# Patient Record
Sex: Female | Born: 1955 | Race: White | Hispanic: No | Marital: Married | Smoking: Never smoker
Health system: Southern US, Community
[De-identification: ages and names within clinical notes are randomized; demographics above are authoritative.]

## PROBLEM LIST (undated history)

## (undated) DIAGNOSIS — R112 Nausea with vomiting, unspecified: Secondary | ICD-10-CM

## (undated) DIAGNOSIS — E119 Type 2 diabetes mellitus without complications: Secondary | ICD-10-CM

## (undated) DIAGNOSIS — I10 Essential (primary) hypertension: Secondary | ICD-10-CM

## (undated) DIAGNOSIS — Z9889 Other specified postprocedural states: Secondary | ICD-10-CM

## (undated) DIAGNOSIS — M1990 Unspecified osteoarthritis, unspecified site: Secondary | ICD-10-CM

## (undated) DIAGNOSIS — E669 Obesity, unspecified: Secondary | ICD-10-CM

## (undated) DIAGNOSIS — R200 Anesthesia of skin: Secondary | ICD-10-CM

## (undated) DIAGNOSIS — E7800 Pure hypercholesterolemia, unspecified: Secondary | ICD-10-CM

## (undated) HISTORY — PX: APPENDECTOMY: SHX54

## (undated) HISTORY — PX: OTHER SURGICAL HISTORY: SHX169

---

## 1999-07-22 ENCOUNTER — Encounter: Admission: RE | Admit: 1999-07-22 | Discharge: 1999-07-22 | Payer: Self-pay | Admitting: Gynecology

## 1999-07-22 ENCOUNTER — Encounter: Payer: Self-pay | Admitting: Gynecology

## 2000-04-08 ENCOUNTER — Encounter (INDEPENDENT_AMBULATORY_CARE_PROVIDER_SITE_OTHER): Payer: Self-pay

## 2000-04-08 ENCOUNTER — Other Ambulatory Visit: Admission: RE | Admit: 2000-04-08 | Discharge: 2000-04-08 | Payer: Self-pay | Admitting: Gynecology

## 2000-08-03 ENCOUNTER — Other Ambulatory Visit: Admission: RE | Admit: 2000-08-03 | Discharge: 2000-08-03 | Payer: Self-pay | Admitting: Gynecology

## 2001-02-01 ENCOUNTER — Other Ambulatory Visit: Admission: RE | Admit: 2001-02-01 | Discharge: 2001-02-01 | Payer: Self-pay | Admitting: Gynecology

## 2001-02-25 ENCOUNTER — Encounter: Payer: Self-pay | Admitting: Gynecology

## 2001-02-25 ENCOUNTER — Encounter: Admission: RE | Admit: 2001-02-25 | Discharge: 2001-02-25 | Payer: Self-pay | Admitting: Gynecology

## 2001-07-27 ENCOUNTER — Other Ambulatory Visit: Admission: RE | Admit: 2001-07-27 | Discharge: 2001-07-27 | Payer: Self-pay | Admitting: Gynecology

## 2001-12-16 ENCOUNTER — Other Ambulatory Visit: Admission: RE | Admit: 2001-12-16 | Discharge: 2001-12-16 | Payer: Self-pay | Admitting: Gynecology

## 2002-03-03 ENCOUNTER — Other Ambulatory Visit: Admission: RE | Admit: 2002-03-03 | Discharge: 2002-03-03 | Payer: Self-pay | Admitting: Gynecology

## 2002-03-08 ENCOUNTER — Encounter: Payer: Self-pay | Admitting: Gynecology

## 2002-03-08 ENCOUNTER — Encounter: Admission: RE | Admit: 2002-03-08 | Discharge: 2002-03-08 | Payer: Self-pay | Admitting: Gynecology

## 2002-08-25 ENCOUNTER — Other Ambulatory Visit: Admission: RE | Admit: 2002-08-25 | Discharge: 2002-08-25 | Payer: Self-pay | Admitting: Gynecology

## 2003-03-20 ENCOUNTER — Other Ambulatory Visit: Admission: RE | Admit: 2003-03-20 | Discharge: 2003-03-20 | Payer: Self-pay | Admitting: Gynecology

## 2003-03-22 ENCOUNTER — Encounter: Payer: Self-pay | Admitting: Gynecology

## 2003-03-22 ENCOUNTER — Encounter: Admission: RE | Admit: 2003-03-22 | Discharge: 2003-03-22 | Payer: Self-pay | Admitting: Gynecology

## 2004-03-20 ENCOUNTER — Other Ambulatory Visit: Admission: RE | Admit: 2004-03-20 | Discharge: 2004-03-20 | Payer: Self-pay | Admitting: Gynecology

## 2004-04-09 ENCOUNTER — Ambulatory Visit (HOSPITAL_COMMUNITY): Admission: RE | Admit: 2004-04-09 | Discharge: 2004-04-09 | Payer: Self-pay | Admitting: Gynecology

## 2004-04-23 ENCOUNTER — Encounter: Admission: RE | Admit: 2004-04-23 | Discharge: 2004-04-23 | Payer: Self-pay | Admitting: Gynecology

## 2004-05-14 ENCOUNTER — Encounter: Admission: RE | Admit: 2004-05-14 | Discharge: 2004-05-29 | Payer: Self-pay | Admitting: Internal Medicine

## 2005-04-08 ENCOUNTER — Other Ambulatory Visit: Admission: RE | Admit: 2005-04-08 | Discharge: 2005-04-08 | Payer: Self-pay | Admitting: Gynecology

## 2005-05-28 ENCOUNTER — Ambulatory Visit (HOSPITAL_COMMUNITY): Admission: RE | Admit: 2005-05-28 | Discharge: 2005-05-28 | Payer: Self-pay | Admitting: Gynecology

## 2005-06-25 ENCOUNTER — Encounter: Admission: RE | Admit: 2005-06-25 | Discharge: 2005-06-25 | Payer: Self-pay | Admitting: Gynecology

## 2005-12-29 ENCOUNTER — Encounter: Admission: RE | Admit: 2005-12-29 | Discharge: 2005-12-29 | Payer: Self-pay | Admitting: Gynecology

## 2006-04-28 ENCOUNTER — Other Ambulatory Visit: Admission: RE | Admit: 2006-04-28 | Discharge: 2006-04-28 | Payer: Self-pay | Admitting: Gynecology

## 2006-06-05 ENCOUNTER — Encounter: Admission: RE | Admit: 2006-06-05 | Discharge: 2006-06-05 | Payer: Self-pay | Admitting: Gynecology

## 2007-05-17 ENCOUNTER — Other Ambulatory Visit: Admission: RE | Admit: 2007-05-17 | Discharge: 2007-05-17 | Payer: Self-pay | Admitting: Gynecology

## 2007-07-07 ENCOUNTER — Encounter: Admission: RE | Admit: 2007-07-07 | Discharge: 2007-07-07 | Payer: Self-pay | Admitting: Gynecology

## 2007-07-13 ENCOUNTER — Encounter: Admission: RE | Admit: 2007-07-13 | Discharge: 2007-07-13 | Payer: Self-pay | Admitting: Gynecology

## 2008-01-05 ENCOUNTER — Encounter: Admission: RE | Admit: 2008-01-05 | Discharge: 2008-01-05 | Payer: Self-pay | Admitting: Gynecology

## 2008-07-11 ENCOUNTER — Encounter: Admission: RE | Admit: 2008-07-11 | Discharge: 2008-07-11 | Payer: Self-pay | Admitting: Gynecology

## 2009-06-23 DIAGNOSIS — R112 Nausea with vomiting, unspecified: Secondary | ICD-10-CM

## 2009-06-23 DIAGNOSIS — Z9889 Other specified postprocedural states: Secondary | ICD-10-CM

## 2009-06-23 HISTORY — DX: Nausea with vomiting, unspecified: Z98.890

## 2009-06-23 HISTORY — PX: JOINT REPLACEMENT: SHX530

## 2009-06-23 HISTORY — DX: Other specified postprocedural states: R11.2

## 2009-08-07 ENCOUNTER — Encounter: Admission: RE | Admit: 2009-08-07 | Discharge: 2009-08-07 | Payer: Self-pay | Admitting: Gynecology

## 2009-10-29 ENCOUNTER — Inpatient Hospital Stay (HOSPITAL_COMMUNITY): Admission: RE | Admit: 2009-10-29 | Discharge: 2009-10-30 | Payer: Self-pay | Admitting: Orthopedic Surgery

## 2010-07-15 ENCOUNTER — Other Ambulatory Visit: Payer: Self-pay | Admitting: Gynecology

## 2010-07-15 DIAGNOSIS — Z1239 Encounter for other screening for malignant neoplasm of breast: Secondary | ICD-10-CM

## 2010-08-26 ENCOUNTER — Ambulatory Visit
Admission: RE | Admit: 2010-08-26 | Discharge: 2010-08-26 | Disposition: A | Discharge status: Home / Self Care | Payer: 59 | Source: Ambulatory Visit | Attending: Gynecology | Admitting: Gynecology

## 2010-08-26 DIAGNOSIS — Z1239 Encounter for other screening for malignant neoplasm of breast: Secondary | ICD-10-CM

## 2010-09-10 LAB — URINALYSIS, ROUTINE W REFLEX MICROSCOPIC
Bilirubin Urine: NEGATIVE
Ketones, ur: NEGATIVE mg/dL
Urobilinogen, UA: 0.2 mg/dL (ref 0.0–1.0)
pH: 6.5 (ref 5.0–8.0)

## 2010-09-10 LAB — BASIC METABOLIC PANEL
BUN: 5 mg/dL — ABNORMAL LOW (ref 6–23)
BUN: 8 mg/dL (ref 6–23)
CO2: 29 mEq/L (ref 19–32)
Calcium: 9.4 mg/dL (ref 8.4–10.5)
Chloride: 103 mEq/L (ref 96–112)
Creatinine, Ser: 0.77 mg/dL (ref 0.4–1.2)
GFR calc Af Amer: >60 mL/min (ref >60)
GFR calc non Af Amer: >60 mL/min (ref >60)
Potassium: 3.7 mEq/L (ref 3.5–5.1)
Sodium: 139 mEq/L (ref 135–145)

## 2010-09-10 LAB — DIFFERENTIAL
Basophils Absolute: 0 10*3/uL (ref 0.0–0.1)
Eosinophils Absolute: 0.1 10*3/uL (ref 0.0–0.7)
Lymphocytes Relative: 29 % (ref 12–46)
Lymphs Abs: 2.6 10*3/uL (ref 0.7–4.0)
Monocytes Absolute: 0.6 10*3/uL (ref 0.1–1.0)
Neutro Abs: 5.7 10*3/uL (ref 1.7–7.7)

## 2010-09-10 LAB — ABO/RH: ABO/RH(D): O POS

## 2010-09-10 LAB — PROTIME-INR: Prothrombin Time: 13.3 seconds (ref 11.6–15.2)

## 2010-09-10 LAB — CBC
HCT: 35.7 % — ABNORMAL LOW (ref 36.0–46.0)
Hemoglobin: 11.9 g/dL — ABNORMAL LOW (ref 12.0–15.0)
Hemoglobin: 14.3 g/dL (ref 12.0–15.0)
MCV: 94.6 fL (ref 78.0–100.0)
RBC: 3.73 MIL/uL — ABNORMAL LOW (ref 3.87–5.11)
RBC: 4.43 MIL/uL (ref 3.87–5.11)
WBC: 7.5 10*3/uL (ref 4.0–10.5)

## 2010-09-10 LAB — TYPE AND SCREEN: ABO/RH(D): O POS

## 2010-09-10 LAB — APTT: aPTT: 28 seconds (ref 24–37)

## 2010-09-10 LAB — URINE MICROSCOPIC-ADD ON

## 2011-07-22 ENCOUNTER — Other Ambulatory Visit: Payer: Self-pay | Admitting: Gynecology

## 2011-07-22 DIAGNOSIS — Z1231 Encounter for screening mammogram for malignant neoplasm of breast: Secondary | ICD-10-CM

## 2011-09-01 ENCOUNTER — Ambulatory Visit
Admission: RE | Admit: 2011-09-01 | Discharge: 2011-09-01 | Disposition: A | Discharge status: Home / Self Care | Payer: BC Managed Care – PPO | Source: Ambulatory Visit | Attending: Gynecology | Admitting: Gynecology

## 2011-09-01 DIAGNOSIS — Z1231 Encounter for screening mammogram for malignant neoplasm of breast: Secondary | ICD-10-CM

## 2012-08-09 ENCOUNTER — Other Ambulatory Visit: Payer: Self-pay | Admitting: Family Medicine

## 2012-08-09 DIAGNOSIS — Z1231 Encounter for screening mammogram for malignant neoplasm of breast: Secondary | ICD-10-CM

## 2012-09-06 ENCOUNTER — Ambulatory Visit: Payer: BC Managed Care – PPO

## 2012-09-30 ENCOUNTER — Ambulatory Visit
Admission: RE | Admit: 2012-09-30 | Discharge: 2012-09-30 | Disposition: A | Discharge status: Home / Self Care | Payer: BC Managed Care – PPO | Source: Ambulatory Visit | Attending: Family Medicine | Admitting: Family Medicine

## 2012-09-30 DIAGNOSIS — Z1231 Encounter for screening mammogram for malignant neoplasm of breast: Secondary | ICD-10-CM

## 2013-03-11 ENCOUNTER — Encounter (HOSPITAL_COMMUNITY): Payer: Self-pay | Admitting: Pharmacy Technician

## 2013-03-14 ENCOUNTER — Other Ambulatory Visit (HOSPITAL_COMMUNITY): Payer: Self-pay | Admitting: *Deleted

## 2013-03-15 ENCOUNTER — Ambulatory Visit (HOSPITAL_COMMUNITY)
Admission: RE | Admit: 2013-03-15 | Discharge: 2013-03-15 | Disposition: A | Discharge status: Home / Self Care | Payer: BC Managed Care – PPO | Source: Ambulatory Visit | Attending: Orthopedic Surgery | Admitting: Orthopedic Surgery

## 2013-03-15 ENCOUNTER — Encounter (HOSPITAL_COMMUNITY): Payer: Self-pay

## 2013-03-15 ENCOUNTER — Encounter (HOSPITAL_COMMUNITY)
Admission: RE | Admit: 2013-03-15 | Discharge: 2013-03-15 | Disposition: A | Discharge status: Home / Self Care | Payer: BC Managed Care – PPO | Source: Ambulatory Visit | Attending: Orthopedic Surgery | Admitting: Orthopedic Surgery

## 2013-03-15 DIAGNOSIS — M1610 Unilateral primary osteoarthritis, unspecified hip: Secondary | ICD-10-CM | POA: Insufficient documentation

## 2013-03-15 DIAGNOSIS — Z01818 Encounter for other preprocedural examination: Secondary | ICD-10-CM | POA: Insufficient documentation

## 2013-03-15 DIAGNOSIS — I10 Essential (primary) hypertension: Secondary | ICD-10-CM | POA: Insufficient documentation

## 2013-03-15 DIAGNOSIS — M169 Osteoarthritis of hip, unspecified: Secondary | ICD-10-CM | POA: Insufficient documentation

## 2013-03-15 DIAGNOSIS — Z01812 Encounter for preprocedural laboratory examination: Secondary | ICD-10-CM | POA: Insufficient documentation

## 2013-03-15 HISTORY — DX: Type 2 diabetes mellitus without complications: E11.9

## 2013-03-15 HISTORY — DX: Nausea with vomiting, unspecified: R11.2

## 2013-03-15 HISTORY — DX: Other specified postprocedural states: Z98.890

## 2013-03-15 HISTORY — DX: Essential (primary) hypertension: I10

## 2013-03-15 HISTORY — DX: Unspecified osteoarthritis, unspecified site: M19.90

## 2013-03-15 LAB — PROTIME-INR: INR: 0.96 (ref 0.00–1.49)

## 2013-03-15 LAB — CBC
HCT: 42.2 % (ref 36.0–46.0)
Hemoglobin: 14.9 g/dL (ref 12.0–15.0)
MCH: 32.4 pg (ref 26.0–34.0)
MCHC: 35.3 g/dL (ref 30.0–36.0)
MCV: 91.7 fL (ref 78.0–100.0)
RBC: 4.6 MIL/uL (ref 3.87–5.11)
RDW: 11.6 % (ref 11.5–15.5)

## 2013-03-15 LAB — BASIC METABOLIC PANEL
BUN: 12 mg/dL (ref 6–23)
CO2: 25 mEq/L (ref 19–32)
Calcium: 9.9 mg/dL (ref 8.4–10.5)
Glucose, Bld: 126 mg/dL — ABNORMAL HIGH (ref 70–99)
Potassium: 4 mEq/L (ref 3.5–5.1)
Sodium: 136 mEq/L (ref 135–145)

## 2013-03-15 LAB — URINALYSIS, ROUTINE W REFLEX MICROSCOPIC
Glucose, UA: NEGATIVE mg/dL
Specific Gravity, Urine: 1.022 (ref 1.005–1.030)
Urobilinogen, UA: 0.2 mg/dL (ref 0.0–1.0)
pH: 6 (ref 5.0–8.0)

## 2013-03-15 LAB — URINE MICROSCOPIC-ADD ON

## 2013-03-15 LAB — APTT: aPTT: 28 seconds (ref 24–37)

## 2013-03-15 LAB — SURGICAL PCR SCREEN: Staphylococcus aureus: NEGATIVE

## 2013-03-15 NOTE — Patient Instructions (Addendum)
22 Sandy Carpenter  03/15/2013   Your procedure is scheduled on: 03-22-2013  Report to [facility] at 515 AM.  Call this number if you have problems the morning of surgery [phone]   Remember:   Do not eat food or drink liquids :After Midnight.     Take these medicines the morning of surgery with A SIP OF WATER: no meds to take                                SEE [name] PREPARING FOR SURGERY SHEET             You may not have any metal on your body including hair pins and piercings  Do not wear jewelry, make-up.  Do not wear lotions, powders, or perfumes. You may wear deodorant.   Men may shave face and neck.  Do not bring valuables to the hospital. [facility] IS NOT RESPONSIBLE FOR VALUEABLES.  Contacts, dentures or bridgework may not be worn into surgery.  Leave suitcase in the car. After surgery it may be brought to your room.  For patients admitted to the hospital, checkout time is 11:00 AM the day of discharge.   Patients discharged the day of surgery will not be allowed to drive home.  Name and phone number of your driver:  Special Instructions: N/A   Please read over the following fact sheets that you were given: mrsa information, blood fact sheet, incentive spirometer fact sheet  Call Cain Sieve RN pre op nurse if needed [phone]    FAILURE TO FOLLOW THESE INSTRUCTIONS MAY RESULT IN THE CANCELLATION OF YOUR SURGERY.  PATIENT SIGNATURE___________________________________________  NURSE SIGNATURE_____________________________________________

## 2013-03-15 NOTE — Progress Notes (Signed)
Pt going 03-16-2013 to have dentist check 2nd from back bottom left tooth has been bothering her, pt will let dentist know she is having 03-22-2013 hip replacement and make sure that is ok with dentist.

## 2013-03-15 NOTE — Progress Notes (Signed)
ekg 02-15-13 [facility] medicine on chart Medical clearance note Elizabeth Palau np 02-15-13 on chart

## 2013-03-15 NOTE — Progress Notes (Signed)
Micro ua results fax by epic to dr Charlann Boxer

## 2013-03-20 NOTE — H&P (Signed)
TOTAL HIP ADMISSION H&P  Patient is admitted for left total hip arthroplasty, anterior approach.  Subjective:  Chief Complaint: left hip OA / pain  HPI: Sandy Carpenter, 57 y.o. female, has a history of pain and functional disability in the left hip(s) due to arthritis and patient has failed non-surgical conservative treatments for greater than 12 weeks to include NSAID's and/or analgesics and activity modification.  Onset of symptoms was gradual starting 1+ years ago with rapidlly worsening course since that time.The patient noted no past surgery on the left hip(s).  Patient currently rates pain in the left hip at 10 out of 10 with activity. Patient has night pain, worsening of pain with activity and weight bearing, trendelenberg gait, pain that interfers with activities of daily living and pain with passive range of motion. Patient has evidence of periarticular osteophytes and joint space narrowing by imaging studies.  There is no current active infection.  Risks, benefits and expectations were discussed with the patient. Patient understand the risks, benefits and expectations and wishes to proceed with surgery.   D/C Plans:   Home with HHPT  Post-op Meds:     Rx given for ASA, Robaxin, Celebrex, Iron, Colace and MiraLax  Tranexamic Acid:   To be given  Decadron:    To be given  FYI:    ASA post-op  Norco post-op   Past Medical History  Diagnosis Date  . Hypertension   . Diabetes mellitus without complication     diet controlled  . Arthritis   . PONV (postoperative nausea and vomiting) 2011    Past Surgical History  Procedure Laterality Date  . Joint replacement Right 2011    partial knee  . Arthroscopic knee surgery Right 1984 and 2009  . Appendectomy  age 64    No Known Allergies   History  Substance Use Topics  . Smoking status: Not on file  . Smokeless tobacco: Not on file  . Alcohol Use: Not on file    No family history on file.   Review of Systems   Constitutional: Negative.   HENT: Negative.   Eyes: Negative.   Respiratory: Negative.   Cardiovascular: Negative.   Gastrointestinal: Negative.   Genitourinary: Negative.   Musculoskeletal: Positive for joint pain.  Skin: Negative.   Neurological: Negative.   Endo/Heme/Allergies: Negative.   Psychiatric/Behavioral: Negative.     Objective:  Physical Exam  Constitutional: She is oriented to person, place, and time. She appears well-developed and well-nourished.  HENT:  Head: Normocephalic and atraumatic.  Mouth/Throat: Oropharynx is clear and moist.  Eyes: Pupils are equal, round, and reactive to light.  Neck: Neck supple. No JVD present. No tracheal deviation present. No thyromegaly present.  Cardiovascular: Normal rate, regular rhythm, normal heart sounds and intact distal pulses.   Respiratory: Effort normal and breath sounds normal. No stridor. No respiratory distress. She has no wheezes.  GI: Soft. There is no tenderness. There is no guarding.  Musculoskeletal:       Left hip: She exhibits decreased range of motion, decreased strength, tenderness and bony tenderness. She exhibits no swelling, no deformity and no laceration.  Lymphadenopathy:    She has no cervical adenopathy.  Neurological: She is alert and oriented to person, place, and time.  Skin: Skin is warm and dry.  Psychiatric: She has a normal mood and affect.     Imaging Review Plain radiographs demonstrate severe degenerative joint disease of the left hip(s). The bone quality appears to be good for  age and reported activity level.  Assessment/Plan:  End stage arthritis, left hip(s)  The patient history, physical examination, clinical judgement of the provider and imaging studies are consistent with end stage degenerative joint disease of the left hip(s) and total hip arthroplasty is deemed medically necessary. The treatment options including medical management, injection therapy, arthroscopy and  arthroplasty were discussed at length. The risks and benefits of total hip arthroplasty were presented and reviewed. The risks due to aseptic loosening, infection, stiffness, dislocation/subluxation,  thromboembolic complications and other imponderables were discussed.  The patient acknowledged the explanation, agreed to proceed with the plan and consent was signed. Patient is being admitted for inpatient treatment for surgery, pain control, PT, OT, prophylactic antibiotics, VTE prophylaxis, progressive ambulation and ADL's and discharge planning.The patient is planning to be discharged home with home health services.    Anastasio Auerbach Temitope Griffing   PAC  03/20/2013, 10:04 PM

## 2013-03-22 ENCOUNTER — Inpatient Hospital Stay (HOSPITAL_COMMUNITY)
Admission: RE | Admit: 2013-03-22 | Discharge: 2013-03-23 | DRG: 818 | Disposition: A | Discharge status: Home Health | Payer: BC Managed Care – PPO | Source: Ambulatory Visit | Attending: Orthopedic Surgery | Admitting: Orthopedic Surgery

## 2013-03-22 ENCOUNTER — Encounter (HOSPITAL_COMMUNITY)
Admission: RE | Disposition: A | Discharge status: Home Health | Payer: Self-pay | Source: Ambulatory Visit | Attending: Orthopedic Surgery

## 2013-03-22 ENCOUNTER — Encounter (HOSPITAL_COMMUNITY): Payer: Self-pay | Admitting: *Deleted

## 2013-03-22 ENCOUNTER — Ambulatory Visit (HOSPITAL_COMMUNITY): Payer: BC Managed Care – PPO

## 2013-03-22 ENCOUNTER — Ambulatory Visit (HOSPITAL_COMMUNITY): Payer: BC Managed Care – PPO | Admitting: Certified Registered Nurse Anesthetist

## 2013-03-22 ENCOUNTER — Encounter (HOSPITAL_COMMUNITY): Payer: Self-pay | Admitting: Certified Registered Nurse Anesthetist

## 2013-03-22 DIAGNOSIS — I10 Essential (primary) hypertension: Secondary | ICD-10-CM | POA: Diagnosis present

## 2013-03-22 DIAGNOSIS — E119 Type 2 diabetes mellitus without complications: Secondary | ICD-10-CM | POA: Diagnosis present

## 2013-03-22 DIAGNOSIS — D500 Iron deficiency anemia secondary to blood loss (chronic): Secondary | ICD-10-CM | POA: Diagnosis not present

## 2013-03-22 DIAGNOSIS — Z6834 Body mass index (BMI) 34.0-34.9, adult: Secondary | ICD-10-CM

## 2013-03-22 DIAGNOSIS — Z96649 Presence of unspecified artificial hip joint: Secondary | ICD-10-CM

## 2013-03-22 DIAGNOSIS — E669 Obesity, unspecified: Secondary | ICD-10-CM | POA: Diagnosis present

## 2013-03-22 DIAGNOSIS — M169 Osteoarthritis of hip, unspecified: Principal | ICD-10-CM | POA: Diagnosis present

## 2013-03-22 DIAGNOSIS — D62 Acute posthemorrhagic anemia: Secondary | ICD-10-CM | POA: Diagnosis not present

## 2013-03-22 DIAGNOSIS — M1610 Unilateral primary osteoarthritis, unspecified hip: Principal | ICD-10-CM | POA: Diagnosis present

## 2013-03-22 HISTORY — PX: TOTAL HIP ARTHROPLASTY: SHX124

## 2013-03-22 LAB — TYPE AND SCREEN: Antibody Screen: NEGATIVE

## 2013-03-22 LAB — GLUCOSE, CAPILLARY: Glucose-Capillary: 110 mg/dL — ABNORMAL HIGH (ref 70–99)

## 2013-03-22 SURGERY — ARTHROPLASTY, HIP, TOTAL, ANTERIOR APPROACH
Anesthesia: Spinal | Site: Hip | Laterality: Left | Wound class: Clean

## 2013-03-22 MED ORDER — FERROUS SULFATE 325 (65 FE) MG PO TABS
325.0000 mg | ORAL_TABLET | Freq: Three times a day (TID) | ORAL | Status: DC
  Administered 2013-03-23 (×2): 325 mg via ORAL
  Filled 2013-03-22 (×5): qty 1

## 2013-03-22 MED ORDER — BUPIVACAINE HCL (PF) 0.5 % IJ SOLN
INTRAMUSCULAR | Status: AC
  Filled 2013-03-22: qty 30

## 2013-03-22 MED ORDER — NON FORMULARY: 20.0000 mg | Freq: Every day | Status: DC

## 2013-03-22 MED ORDER — LIDOCAINE HCL (CARDIAC) 20 MG/ML IV SOLN
INTRAVENOUS | Status: DC | PRN
  Administered 2013-03-22: 50 mg via INTRAVENOUS

## 2013-03-22 MED ORDER — CALCIUM CARBONATE-VITAMIN D 500-200 MG-UNIT PO TABS
1.0000 | ORAL_TABLET | Freq: Two times a day (BID) | ORAL | Status: DC
  Administered 2013-03-22 – 2013-03-23 (×2): 1 via ORAL
  Filled 2013-03-22 (×3): qty 1

## 2013-03-22 MED ORDER — HYDROMORPHONE HCL PF 1 MG/ML IJ SOLN
0.2500 mg | INTRAMUSCULAR | Status: DC | PRN
  Administered 2013-03-22 (×2): 0.5 mg via INTRAVENOUS

## 2013-03-22 MED ORDER — BUPIVACAINE HCL (PF) 0.5 % IJ SOLN
INTRAMUSCULAR | Status: DC | PRN
  Administered 2013-03-22: 15 mg

## 2013-03-22 MED ORDER — MIDAZOLAM HCL 5 MG/5ML IJ SOLN
INTRAMUSCULAR | Status: DC | PRN
  Administered 2013-03-22: 0.5 mg via INTRAVENOUS
  Administered 2013-03-22: 2 mg via INTRAVENOUS
  Administered 2013-03-22: 0.5 mg via INTRAVENOUS
  Administered 2013-03-22: 1 mg via INTRAVENOUS

## 2013-03-22 MED ORDER — DEXAMETHASONE SODIUM PHOSPHATE 10 MG/ML IJ SOLN
10.0000 mg | Freq: Once | INTRAMUSCULAR | Status: AC
  Administered 2013-03-22: 10 mg via INTRAVENOUS

## 2013-03-22 MED ORDER — PHENYLEPHRINE HCL 10 MG/ML IJ SOLN
INTRAMUSCULAR | Status: DC | PRN
  Administered 2013-03-22 (×2): 40 ug via INTRAVENOUS
  Administered 2013-03-22: 60 ug via INTRAVENOUS
  Administered 2013-03-22: 40 ug via INTRAVENOUS

## 2013-03-22 MED ORDER — STERILE WATER FOR IRRIGATION IR SOLN
Status: DC | PRN
  Administered 2013-03-22: 3000 mL

## 2013-03-22 MED ORDER — DEXAMETHASONE SODIUM PHOSPHATE 10 MG/ML IJ SOLN
10.0000 mg | Freq: Once | INTRAMUSCULAR | Status: AC
  Administered 2013-03-23: 09:00:00 10 mg via INTRAVENOUS
  Filled 2013-03-22: qty 1

## 2013-03-22 MED ORDER — SODIUM CHLORIDE 0.9 % IV SOLN
INTRAVENOUS | Status: DC
  Administered 2013-03-23: 03:00:00 via INTRAVENOUS
  Filled 2013-03-22 (×5): qty 1000

## 2013-03-22 MED ORDER — PROMETHAZINE HCL 25 MG/ML IJ SOLN: 6.2500 mg | INTRAMUSCULAR | Status: DC | PRN

## 2013-03-22 MED ORDER — HYDROCODONE-ACETAMINOPHEN 7.5-325 MG PO TABS
1.0000 | ORAL_TABLET | ORAL | Status: DC
  Administered 2013-03-22: 2 via ORAL
  Administered 2013-03-22: 1 via ORAL
  Administered 2013-03-23 (×4): 2 via ORAL
  Filled 2013-03-22 (×2): qty 2
  Filled 2013-03-22: qty 1
  Filled 2013-03-22 (×3): qty 2

## 2013-03-22 MED ORDER — HYDROMORPHONE HCL PF 1 MG/ML IJ SOLN
0.5000 mg | INTRAMUSCULAR | Status: DC | PRN
  Administered 2013-03-22 (×2): 1 mg via INTRAVENOUS
  Filled 2013-03-22: qty 1

## 2013-03-22 MED ORDER — VITAMIN D3 25 MCG (1000 UNIT) PO TABS
1000.0000 [IU] | ORAL_TABLET | Freq: Every day | ORAL | Status: DC
  Administered 2013-03-23: 1000 [IU] via ORAL
  Filled 2013-03-22: qty 1

## 2013-03-22 MED ORDER — HYDROMORPHONE HCL PF 1 MG/ML IJ SOLN
INTRAMUSCULAR | Status: AC
  Filled 2013-03-22: qty 1

## 2013-03-22 MED ORDER — SENNA 8.6 MG PO TABS
1.0000 | ORAL_TABLET | Freq: Two times a day (BID) | ORAL | Status: DC
  Administered 2013-03-22: 21:00:00 8.6 mg via ORAL
  Filled 2013-03-22: qty 1

## 2013-03-22 MED ORDER — POLYETHYLENE GLYCOL 3350 17 G PO PACK: 17.0000 g | PACK | Freq: Every day | ORAL | Status: DC | PRN

## 2013-03-22 MED ORDER — ALUM & MAG HYDROXIDE-SIMETH 200-200-20 MG/5ML PO SUSP: 30.0000 mL | ORAL | Status: DC | PRN

## 2013-03-22 MED ORDER — CEFAZOLIN SODIUM-DEXTROSE 2-3 GM-% IV SOLR
2.0000 g | Freq: Four times a day (QID) | INTRAVENOUS | Status: AC
  Administered 2013-03-22 (×2): 2 g via INTRAVENOUS
  Filled 2013-03-22 (×2): qty 50

## 2013-03-22 MED ORDER — DOXEPIN HCL 25 MG PO CAPS
25.0000 mg | ORAL_CAPSULE | Freq: Every day | ORAL | Status: DC
  Administered 2013-03-22: 21:00:00 25 mg via ORAL
  Filled 2013-03-22 (×2): qty 1

## 2013-03-22 MED ORDER — DIPHENHYDRAMINE HCL 12.5 MG/5ML PO ELIX: 25.0000 mg | ORAL_SOLUTION | Freq: Four times a day (QID) | ORAL | Status: DC | PRN

## 2013-03-22 MED ORDER — ONDANSETRON HCL 4 MG PO TABS: 4.0000 mg | ORAL_TABLET | Freq: Four times a day (QID) | ORAL | Status: DC | PRN

## 2013-03-22 MED ORDER — ASPIRIN EC 325 MG PO TBEC
325.0000 mg | DELAYED_RELEASE_TABLET | Freq: Two times a day (BID) | ORAL | Status: DC
  Administered 2013-03-23: 325 mg via ORAL
  Filled 2013-03-22 (×3): qty 1

## 2013-03-22 MED ORDER — DOCUSATE SODIUM 100 MG PO CAPS
100.0000 mg | ORAL_CAPSULE | Freq: Two times a day (BID) | ORAL | Status: DC
  Administered 2013-03-22 – 2013-03-23 (×2): 100 mg via ORAL

## 2013-03-22 MED ORDER — METHOCARBAMOL 500 MG PO TABS
500.0000 mg | ORAL_TABLET | Freq: Four times a day (QID) | ORAL | Status: DC | PRN
  Administered 2013-03-23: 02:00:00 500 mg via ORAL
  Filled 2013-03-22: qty 1

## 2013-03-22 MED ORDER — 0.9 % SODIUM CHLORIDE (POUR BTL) OPTIME
TOPICAL | Status: DC | PRN
  Administered 2013-03-22: 1000 mL

## 2013-03-22 MED ORDER — MENTHOL 3 MG MT LOZG: 1.0000 | LOZENGE | OROMUCOSAL | Status: DC | PRN

## 2013-03-22 MED ORDER — LACTATED RINGERS IV SOLN
INTRAVENOUS | Status: DC | PRN
  Administered 2013-03-22 (×3): via INTRAVENOUS

## 2013-03-22 MED ORDER — OLMESARTAN MEDOXOMIL 20 MG PO TABS
20.0000 mg | ORAL_TABLET | Freq: Every day | ORAL | Status: DC
  Administered 2013-03-23: 20 mg via ORAL
  Filled 2013-03-22: qty 1

## 2013-03-22 MED ORDER — ONDANSETRON HCL 4 MG/2ML IJ SOLN
4.0000 mg | Freq: Four times a day (QID) | INTRAMUSCULAR | Status: DC | PRN
  Administered 2013-03-22 (×2): 4 mg via INTRAVENOUS
  Filled 2013-03-22 (×2): qty 2

## 2013-03-22 MED ORDER — FENTANYL CITRATE 0.05 MG/ML IJ SOLN
INTRAMUSCULAR | Status: DC | PRN
  Administered 2013-03-22: 100 ug via INTRAVENOUS

## 2013-03-22 MED ORDER — CEFAZOLIN SODIUM-DEXTROSE 2-3 GM-% IV SOLR
INTRAVENOUS | Status: AC
  Filled 2013-03-22: qty 50

## 2013-03-22 MED ORDER — ONDANSETRON HCL 4 MG/2ML IJ SOLN
INTRAMUSCULAR | Status: AC | PRN
  Administered 2013-03-22 – 2015-09-10 (×2): 4 mg via INTRAVENOUS

## 2013-03-22 MED ORDER — TRANEXAMIC ACID 100 MG/ML IV SOLN
1000.0000 mg | Freq: Once | INTRAVENOUS | Status: AC
  Administered 2013-03-22: 1000 mg via INTRAVENOUS
  Filled 2013-03-22: qty 10

## 2013-03-22 MED ORDER — METHOCARBAMOL 100 MG/ML IJ SOLN
500.0000 mg | Freq: Four times a day (QID) | INTRAVENOUS | Status: DC | PRN
  Administered 2013-03-22: 500 mg via INTRAVENOUS
  Filled 2013-03-22: qty 5

## 2013-03-22 MED ORDER — PROPOFOL INFUSION 10 MG/ML OPTIME
INTRAVENOUS | Status: DC | PRN
  Administered 2013-03-22: 120 ug/kg/min via INTRAVENOUS

## 2013-03-22 MED ORDER — CEFAZOLIN SODIUM-DEXTROSE 2-3 GM-% IV SOLR
2.0000 g | INTRAVENOUS | Status: AC
  Administered 2013-03-22: 2 g via INTRAVENOUS

## 2013-03-22 MED ORDER — PHENOL 1.4 % MT LIQD: 1.0000 | OROMUCOSAL | Status: DC | PRN

## 2013-03-22 SURGICAL SUPPLY — 40 items
ADH SKN CLS APL DERMABOND .7 (GAUZE/BANDAGES/DRESSINGS) ×1
BAG SPEC THK2 15X12 ZIP CLS (MISCELLANEOUS) ×2
BAG ZIPLOCK 12X15 (MISCELLANEOUS) ×4 IMPLANT
BLADE SAW SGTL 18X1.27X75 (BLADE) ×2 IMPLANT
CAPT HIP PF COP ×2 IMPLANT
CLOTH BEACON ORANGE TIMEOUT ST (SAFETY) ×2 IMPLANT
DERMABOND ADVANCED (GAUZE/BANDAGES/DRESSINGS) ×1
DERMABOND ADVANCED .7 DNX12 (GAUZE/BANDAGES/DRESSINGS) ×1 IMPLANT
DRAPE C-ARM 42X120 X-RAY (DRAPES) ×2 IMPLANT
DRAPE STERI IOBAN 125X83 (DRAPES) ×2 IMPLANT
DRAPE U-SHAPE 47X51 STRL (DRAPES) ×6 IMPLANT
DRSG AQUACEL AG ADV 3.5X10 (GAUZE/BANDAGES/DRESSINGS) ×2 IMPLANT
DRSG TEGADERM 4X4.75 (GAUZE/BANDAGES/DRESSINGS) IMPLANT
DURAPREP 26ML APPLICATOR (WOUND CARE) ×2 IMPLANT
ELECT BLADE TIP CTD 4 INCH (ELECTRODE) ×2 IMPLANT
ELECT REM PT RETURN 9FT ADLT (ELECTROSURGICAL) ×2
ELECTRODE REM PT RTRN 9FT ADLT (ELECTROSURGICAL) ×1 IMPLANT
EVACUATOR 1/8 PVC DRAIN (DRAIN) IMPLANT
FACESHIELD LNG OPTICON STERILE (SAFETY) ×8 IMPLANT
GAUZE SPONGE 2X2 8PLY STRL LF (GAUZE/BANDAGES/DRESSINGS) ×1 IMPLANT
GLOVE BIOGEL PI IND STRL 7.5 (GLOVE) ×1 IMPLANT
GLOVE BIOGEL PI IND STRL 8 (GLOVE) ×1 IMPLANT
GLOVE BIOGEL PI INDICATOR 7.5 (GLOVE) ×1
GLOVE BIOGEL PI INDICATOR 8 (GLOVE) ×1
GLOVE ECLIPSE 8.0 STRL XLNG CF (GLOVE) ×2 IMPLANT
GLOVE ORTHO TXT STRL SZ7.5 (GLOVE) ×4 IMPLANT
GOWN BRE IMP PREV XXLGXLNG (GOWN DISPOSABLE) ×2 IMPLANT
GOWN PREVENTION PLUS LG XLONG (DISPOSABLE) ×2 IMPLANT
KIT BASIN OR (CUSTOM PROCEDURE TRAY) ×2 IMPLANT
PACK TOTAL JOINT (CUSTOM PROCEDURE TRAY) ×2 IMPLANT
PADDING CAST COTTON 6X4 STRL (CAST SUPPLIES) ×2 IMPLANT
SPONGE GAUZE 2X2 STER 10/PKG (GAUZE/BANDAGES/DRESSINGS) ×1
SUCTION FRAZIER 12FR DISP (SUCTIONS) ×2 IMPLANT
SUT MNCRL AB 4-0 PS2 18 (SUTURE) ×2 IMPLANT
SUT VIC AB 1 CT1 36 (SUTURE) ×8 IMPLANT
SUT VIC AB 2-0 CT1 27 (SUTURE) ×2
SUT VIC AB 2-0 CT1 TAPERPNT 27 (SUTURE) ×2 IMPLANT
SUT VLOC 180 0 24IN GS25 (SUTURE) ×2 IMPLANT
TOWEL OR 17X26 10 PK STRL BLUE (TOWEL DISPOSABLE) ×4 IMPLANT
TRAY FOLEY CATH 14FRSI W/METER (CATHETERS) ×2 IMPLANT

## 2013-03-22 NOTE — Anesthesia Procedure Notes (Signed)

## 2013-03-22 NOTE — Anesthesia Postprocedure Evaluation (Signed)
  Anesthesia Post-op Note  Patient: Sandy Carpenter  Procedure(s) Performed: Procedure(s) (LRB): LEFT TOTAL HIP ARTHROPLASTY ANTERIOR APPROACH (Left)  Patient Location: PACU  Anesthesia Type: Spinal  Level of Consciousness: awake and alert   Airway and Oxygen Therapy: Patient Spontanous Breathing  Post-op Pain: mild  Post-op Assessment: Post-op Vital signs reviewed, Patient's Cardiovascular Status Stable, Respiratory Function Stable, Patent Airway and No signs of Nausea or vomiting  Last Vitals:  Filed Vitals:   03/22/13 1015  BP: 110/83  Pulse: 67  Temp: 36.8 C  Resp: 12    Post-op Vital Signs: stable   Complications: No apparent anesthesia complications

## 2013-03-22 NOTE — Evaluation (Signed)
Physical Therapy Evaluation Patient Details Name: Sandy Carpenter MRN: [id number] DOB: 10-Apr-1956 Today's Date: 03/22/2013 Time: 1640-1700 PT Time Calculation (min): 20 min  PT Assessment / Plan / Recommendation History of Present Illness  LDATHA on 03/22/13  Clinical Impression  Pt tolerated ambulating x 100'. Became more nauseated after PT. Pt will benefit from PT to address problems listed below. Plans DC tomorrow.    PT Assessment  Patient needs continued PT services    Follow Up Recommendations  Home health PT    Does the patient have the potential to tolerate intense rehabilitation      Barriers to Discharge        Equipment Recommendations  None recommended by PT (has a RW)    Recommendations for Other Services     Frequency 7X/week    Precautions / Restrictions Precautions Precautions: None Restrictions Weight Bearing Restrictions: No   Pertinent Vitals/Pain L thigh/hip5 after meds when moving. None at rest.      Mobility  Bed Mobility Bed Mobility: Supine to Sit;Sitting - Scoot to Edge of Bed Supine to Sit: 3: Mod assist Sitting - Scoot to Edge of Bed: 4: Min assist Details for Bed Mobility Assistance: cues for using UE's to assist in scooting to edge of bed as LLE supported. Transfers Transfers: Sit to Stand;Stand to Sit Sit to Stand: 4: Min assist;From bed;With upper extremity assist;From elevated surface Stand to Sit: 4: Min guard;With armrests;With upper extremity assist;To chair/3-in-1 Details for Transfer Assistance: multimodal cues for use of UE's and LLE palcement prior to sitting down. Ambulation/Gait Ambulation/Gait Assistance: 4: Min assist Ambulation Distance (Feet): 100 Feet Assistive device: Rolling walker Ambulation/Gait Assistance Details: cues for sequence and attempts to externally rotate LLE during swing. LLE tends to rotate inward.    Exercises Total Joint Exercises Quad Sets: AROM;Left;10 reps;Supine Heel Slides: AAROM;Left;10  reps;Supine   PT Diagnosis: Difficulty walking;Acute pain  PT Problem List: Decreased strength;Decreased range of motion;Decreased activity tolerance;Decreased mobility;Decreased knowledge of use of DME;Decreased safety awareness;Pain PT Treatment Interventions: DME instruction;Gait training;Stair training;Functional mobility training;Therapeutic activities;Therapeutic exercise;Patient/family education     PT Goals(Current goals can be found in the care plan section) Acute Rehab PT Goals Patient Stated Goal: I want to get this pain gone. PT Goal Formulation: With patient/family Time For Goal Achievement: 03/26/13 Potential to Achieve Goals: Good  Visit Information  Last PT Received On: 03/22/13 Assistance Needed: +1 History of Present Illness: LDATHA on 03/22/13       Prior Functioning  Home Living Family/patient expects to be discharged to:: Private residence Living Arrangements: Spouse/significant other Available Help at Discharge: Family Type of Home: House Home Access: Stairs to enter Secretary/administrator of Steps: 1 Entrance Stairs-Rails: None Home Layout: One level Home Equipment: Environmental consultant - 2 wheels Prior Function Level of Independence: Independent Communication Communication: No difficulties    Cognition  Cognition Arousal/Alertness: Awake/alert Behavior During Therapy: WFL for tasks assessed/performed Overall Cognitive Status: Within Functional Limits for tasks assessed    Extremity/Trunk Assessment Upper Extremity Assessment Upper Extremity Assessment: Overall WFL for tasks assessed Lower Extremity Assessment Lower Extremity Assessment: LLE deficits/detail LLE Deficits / Details: requires assistance for moving LLE to edge, cues for technique to turn trunk on bed . Able to advance LLE  during swing.   Balance    End of Session PT - End of Session Activity Tolerance: Patient tolerated treatment well Patient left: in chair;with call bell/phone within  reach;with family/visitor present Nurse Communication: Mobility status  GP  Rada Hay 03/22/2013, 5:29 PM

## 2013-03-22 NOTE — Op Note (Addendum)
NAME:  Sandy Carpenter                ACCOUNT NO.: [id number]      MEDICAL RECORD NO.: [id number]      FACILITY:  [facility]      PHYSICIAN:  Durene Romans D  DATE OF BIRTH:  10/03/1955     DATE OF PROCEDURE:  03/22/2013                                 OPERATIVE REPORT         PREOPERATIVE DIAGNOSIS: Left  hip osteoarthritis.      POSTOPERATIVE DIAGNOSIS:  Left hip osteoarthritis.      PROCEDURE:  Left total hip replacement through an anterior approach   utilizing DePuy THR system, component size 52mm pinnacle cup, a size 36+4 neutral   Altrex liner, a size 3 Hi Tri Lock stem with a 36+5 delta ceramic   ball.      SURGEON:  Madlyn Frankel. Charlann Boxer, M.D.      ASSISTANT:  Leilani Able, PA-C     ANESTHESIA:  Spinal.      SPECIMENS:  None.      COMPLICATIONS:  None.      BLOOD LOSS:  350 cc     DRAINS:  None      INDICATION OF THE PROCEDURE:  Sandy Carpenter is a 57 y.o. female who had   presented to office for evaluation of left hip pain.  Radiographs revealed   progressive degenerative changes with bone-on-bone   articulation to the  hip joint.  The patient had painful limited range of   motion significantly affecting their overall quality of life.  The patient was failing to    respond to conservative measures, and at this point was ready   to proceed with more definitive measures.  The patient has noted progressive   degenerative changes in his hip, progressive problems and dysfunction   with regarding the hip prior to surgery.  Consent was obtained for   benefit of pain relief.  Specific risk of infection, DVT, component   failure, dislocation, need for revision surgery, as well discussion of   the anterior versus posterior approach were reviewed.  Consent was   obtained for benefit of anterior pain relief through an anterior   approach.      PROCEDURE IN DETAIL:  The patient was brought to operative theater.   Once adequate anesthesia,  preoperative antibiotics, 2gm Ancef administered.   The patient was positioned supine on the OSI Hanna table.  Once adequate   padding of boney process was carried out, we had predraped out the hip, and  used fluoroscopy to confirm orientation of the pelvis and position.      The left hip was then prepped and draped from proximal iliac crest to   mid thigh with shower curtain technique.      Time-out was performed identifying the patient, planned procedure, and   extremity.     An incision was then made 2 cm distal and lateral to the   anterior superior iliac spine extending over the orientation of the   tensor fascia lata muscle and sharp dissection was carried down to the   fascia of the muscle and protractor placed in the soft tissues.      The fascia was then incised.  The muscle belly was identified and swept  laterally and retractor placed along the superior neck.  Following   cauterization of the circumflex vessels and removing some pericapsular   fat, a second cobra retractor was placed on the inferior neck.  A third   retractor was placed on the anterior acetabulum after elevating the   anterior rectus.  A L-capsulotomy was along the line of the   superior neck to the trochanteric fossa, then extended proximally and   distally.  Tag sutures were placed and the retractors were then placed   intracapsular.  We then identified the trochanteric fossa and   orientation of my neck cut, confirmed this radiographically   and then made a neck osteotomy with the femur on traction.  The femoral   head was removed without difficulty or complication.  Traction was let   off and retractors were placed posterior and anterior around the   acetabulum.      The labrum and foveal tissue were debrided.  I began reaming with a 47mm   reamer and reamed up to 51mm reamer with good bony bed preparation and a 52   cup was chosen.  The final 52mm Pinnacle cup was then impacted under fluoroscopy  to  confirm the depth of penetration and orientation with respect to   abduction.  A screw was placed followed by the hole eliminator.  The final   36+4 neutral Altrex liner was impacted with good visualized rim fit.  The cup was positioned anatomically within the acetabular portion of the pelvis.      At this point, the femur was rolled at 80 degrees.  Further capsule was   released off the inferior aspect of the femoral neck.  I then   released the superior capsule proximally.  The hook was placed laterally   along the femur and elevated manually and held in position with the bed   hook.  The leg was then extended and adducted with the leg rolled to 100   degrees of external rotation.  Once the proximal femur was fully   exposed, I used a box osteotome to set orientation.  I then began   broaching with the starting chili pepper broach and passed this by hand and then broached up to 3.  With the 3 broach in place I chose a high offset neck and did a trial reduction.  The offset was appropriate, leg lengths   appeared to be equal, confirmed radiographically.   Given these findings, I went ahead and dislocated the hip, repositioned all   retractors and positioned the right hip in the extended and abducted position.  The final 3 Hi Tri Lock stem was   chosen and it was impacted down to the level of neck cut.  Based on this   and the trial reduction, a 36+5 delta ceramic ball was chosen based on re-trials and then   impacted onto a clean and dry trunnion, and the hip was reduced.  The   hip had been irrigated throughout the case again at this point.  I did   reapproximate the superior capsular leaflet to the anterior leaflet   using #1 Vicryl.  The fascia of the   tensor fascia lata muscle was then reapproximated using #1 Vicryl.  The   remaining wound was closed with 2-0 Vicryl and running 4-0 Monocryl.   The hip was cleaned, dried, and dressed sterilely using Dermabond and   Aquacel dressing.   She was then brought   to recovery room  in stable condition tolerating the procedure well.    Leilani Able, PA-C was present for the entirety of the case involved from   preoperative positioning, perioperative retractor management, general   facilitation of the case, as well as primary wound closure as assistant.            Madlyn Frankel Charlann Boxer, M.D.            MDO/MEDQ  D:  04/15/2011  T:  04/15/2011  Job:  [id number]      Electronically Signed by Durene Romans M.D. on 04/21/2011 09:15:38 AM

## 2013-03-22 NOTE — Progress Notes (Signed)
Utilization review completed.  

## 2013-03-22 NOTE — Interval H&P Note (Signed)
History and Physical Interval Note:  03/22/2013 6:47 AM  Sandy Carpenter  has presented today for surgery, with the diagnosis of LEFT HIP OA  The various methods of treatment have been discussed with the patient and family. After consideration of risks, benefits and other options for treatment, the patient has consented to  Procedure(s): LEFT TOTAL HIP ARTHROPLASTY ANTERIOR APPROACH (Left) as a surgical intervention .  The patient's history has been reviewed, patient examined, no change in status, stable for surgery.  I have reviewed the patient's chart and labs.  Questions were answered to the patient's satisfaction.     Shelda Pal

## 2013-03-22 NOTE — Anesthesia Preprocedure Evaluation (Addendum)
Anesthesia Evaluation  Patient identified by MRN, date of birth, ID band Patient awake    Reviewed: Allergy & Precautions, H&P , NPO status , Patient's Chart, lab work & pertinent test results  Airway Mallampati: II TM Distance: >3 FB Neck ROM: Full    Dental no notable dental hx.    Pulmonary neg pulmonary ROS,  breath sounds clear to auscultation  Pulmonary exam normal       Cardiovascular hypertension, Pt. on medications Rhythm:Regular Rate:Normal     Neuro/Psych negative neurological ROS  negative psych ROS   GI/Hepatic negative GI ROS, Neg liver ROS,   Endo/Other  diabetes  Renal/GU negative Renal ROS  negative genitourinary   Musculoskeletal negative musculoskeletal ROS (+)   Abdominal   Peds negative pediatric ROS (+)  Hematology negative hematology ROS (+)   Anesthesia Other Findings   Reproductive/Obstetrics negative OB ROS                           Anesthesia Physical Anesthesia Plan  ASA: II  Anesthesia Plan: Spinal   Post-op Pain Management:    Induction: Intravenous  Airway Management Planned:   Additional Equipment:   Intra-op Plan:   Post-operative Plan:   Informed Consent: I have reviewed the patients History and Physical, chart, labs and discussed the procedure including the risks, benefits and alternatives for the proposed anesthesia with the patient or authorized representative who has indicated his/her understanding and acceptance.   Dental advisory given  Plan Discussed with: CRNA and Surgeon  Anesthesia Plan Comments:        Anesthesia Quick Evaluation

## 2013-03-22 NOTE — Transfer of Care (Signed)
Immediate Anesthesia Transfer of Care Note  Patient: Sandy Carpenter  Procedure(s) Performed: Procedure(s): LEFT TOTAL HIP ARTHROPLASTY ANTERIOR APPROACH (Left)  Patient Location: PACU  Anesthesia Type:Spinal  Level of Consciousness: awake, alert , oriented and patient cooperative  Airway & Oxygen Therapy: Patient Spontanous Breathing and Patient connected to face mask oxygen  Post-op Assessment: Report given to PACU RN and Post -op Vital signs reviewed and stable  Post vital signs: Reviewed and stable  Complications: No apparent anesthesia complications

## 2013-03-23 ENCOUNTER — Encounter (HOSPITAL_COMMUNITY): Payer: Self-pay | Admitting: Orthopedic Surgery

## 2013-03-23 DIAGNOSIS — D500 Iron deficiency anemia secondary to blood loss (chronic): Secondary | ICD-10-CM | POA: Diagnosis not present

## 2013-03-23 DIAGNOSIS — E669 Obesity, unspecified: Secondary | ICD-10-CM | POA: Diagnosis present

## 2013-03-23 LAB — CBC
HCT: 33.1 % — ABNORMAL LOW (ref 36.0–46.0)
Hemoglobin: 11.5 g/dL — ABNORMAL LOW (ref 12.0–15.0)
MCH: 31.7 pg (ref 26.0–34.0)
MCV: 91.2 fL (ref 78.0–100.0)
Platelets: 214 10*3/uL (ref 150–400)
RBC: 3.63 MIL/uL — ABNORMAL LOW (ref 3.87–5.11)

## 2013-03-23 LAB — BASIC METABOLIC PANEL
BUN: 12 mg/dL (ref 6–23)
CO2: 26 mEq/L (ref 19–32)
Calcium: 9.1 mg/dL (ref 8.4–10.5)
Creatinine, Ser: 0.56 mg/dL (ref 0.50–1.10)
Glucose, Bld: 134 mg/dL — ABNORMAL HIGH (ref 70–99)
Sodium: 135 mEq/L (ref 135–145)

## 2013-03-23 MED ORDER — POLYETHYLENE GLYCOL 3350 17 G PO PACK: 17.0000 g | PACK | Freq: Two times a day (BID) | ORAL | Status: DC

## 2013-03-23 MED ORDER — FERROUS SULFATE 325 (65 FE) MG PO TABS: 325.0000 mg | ORAL_TABLET | Freq: Three times a day (TID) | ORAL | Status: DC

## 2013-03-23 MED ORDER — ASPIRIN 325 MG PO TBEC: 325.0000 mg | DELAYED_RELEASE_TABLET | Freq: Two times a day (BID) | ORAL | Status: AC

## 2013-03-23 MED ORDER — METHOCARBAMOL 500 MG PO TABS: 500.0000 mg | ORAL_TABLET | Freq: Four times a day (QID) | ORAL | Status: DC | PRN

## 2013-03-23 MED ORDER — DSS 100 MG PO CAPS: 100.0000 mg | ORAL_CAPSULE | Freq: Two times a day (BID) | ORAL | Status: DC

## 2013-03-23 MED ORDER — HYDROCODONE-ACETAMINOPHEN 7.5-325 MG PO TABS: 1.0000 | ORAL_TABLET | ORAL | Status: DC | PRN

## 2013-03-23 NOTE — Progress Notes (Signed)
Physical Therapy Treatment Patient Details Name: Sandy Carpenter MRN: [id number] DOB: March 29, 1956 Today's Date: 03/23/2013 Time: 1130-1150 PT Time Calculation (min): 20 min  PT Assessment / Plan / Recommendation  History of Present Illness LDATHA on 03/22/13   PT Comments   ready to DC. Practiced steps.  Follow Up Recommendations  Home health PT     Does the patient have the potential to tolerate intense rehabilitation     Barriers to Discharge        Equipment Recommendations  None recommended by PT    Recommendations for Other Services    Frequency 7X/week   Progress towards PT Goals Progress towards PT goals: Progressing toward goals  Plan Current plan remains appropriate    Precautions / Restrictions Precautions Precautions: None Restrictions Weight Bearing Restrictions: No   Pertinent Vitals/Pain 5, RN aware.    Mobility  Bed Mobility Supine to Sit: 4: Min guard;HOB elevated  Sit to Stand: 5: Supervision;From chair/3-in-1 Stand to Sit: To chair/3-in-1;5: Supervision Details for Transfer Assistance: verbal cues for hand placement Ambulation/Gait Ambulation/Gait Assistance: 5: Supervision Ambulation Distance (Feet): 200 Feet Assistive device: Rolling walker Gait Pattern: Step-through pattern Stairs: Yes Stairs Assistance: 4: Min guard Stair Management Technique: One rail Right;With cane;Step to pattern Number of Stairs: 2    Exercises Total Joint Exercises Ankle Circles/Pumps: AROM;Left;10 reps;Supine Quad Sets: AROM;Left;10 reps;Supine Short Arc Quad: AROM;Left;10 reps;Supine Heel Slides: AROM;Left;10 reps;Supine Hip ABduction/ADduction: AAROM;Left;10 reps;Supine   PT Diagnosis:    PT Problem List:   PT Treatment Interventions:     PT Goals (current goals can now be found in the care plan section) Acute Rehab PT Goals Patient Stated Goal: to be able to garden  Visit Information  Last PT Received On: 03/23/13 Assistance Needed: +1 History of  Present Illness: LDATHA on 03/22/13    Subjective Data  Patient Stated Goal: to be able to garden   Cognition  Cognition Arousal/Alertness: Awake/alert Behavior During Therapy: WFL for tasks assessed/performed Overall Cognitive Status: Within Functional Limits for tasks assessed    Balance  Balance Balance Assessed: Yes Dynamic Standing Balance Dynamic Standing - Level of Assistance: 4: Min assist (min guard)  End of Session PT - End of Session Activity Tolerance: Patient tolerated treatment well Patient left: in chair;with family/visitor present Nurse Communication: Mobility status (ready for DC)   GP     Rada Hay 03/23/2013, 12:34 PM

## 2013-03-23 NOTE — Progress Notes (Signed)
[facility]  [facility] is providing the following services: patient declined RW and [employer] - already has both at home.   If patient discharges after hours, please call [phone].   Renard Hamper 03/23/2013, 8:45 AM

## 2013-03-23 NOTE — Progress Notes (Signed)
Physical Therapy Treatment Patient Details Name: Sandy Carpenter MRN: [id number] DOB: Sep 29, 1955 Today's Date: 03/23/2013 Time: 1191-4782 PT Time Calculation (min): 20 min  PT Assessment / Plan / Recommendation  History of Present Illness LDATHA on 03/22/13   PT Comments   Pt much improved, no nausea. Improved active movement L hip. Plans DC after next session.  Follow Up Recommendations  Home health PT     Does the patient have the potential to tolerate intense rehabilitation     Barriers to Discharge        Equipment Recommendations  None recommended by PT    Recommendations for Other Services    Frequency 7X/week   Progress towards PT Goals Progress towards PT goals: Progressing toward goals  Plan Current plan remains appropriate    Precautions / Restrictions Precautions Precautions: None Restrictions Weight Bearing Restrictions: No   Pertinent Vitals/Pain 4    Mobility  Bed Mobility Supine to Sit: 4: Min guard;HOB elevated Sitting - Scoot to Edge of Bed: 5: Supervision Details for Bed Mobility Assistance: cues for using UE's to assist in scooting to edge of bed  Transfers Sit to Stand: 4: Min guard;With upper extremity assist;From bed Stand to Sit: 4: Min guard;With upper extremity assist;To chair/3-in-1 Details for Transfer Assistance: verbal cues for hand placement Ambulation/Gait Ambulation/Gait Assistance: Not tested (comment)    Exercises Total Joint Exercises Ankle Circles/Pumps: AROM;Left;10 reps;Supine Quad Sets: AROM;Left;10 reps;Supine Short Arc Quad: AROM;Left;10 reps;Supine Heel Slides: AROM;Left;10 reps;Supine Hip ABduction/ADduction: AAROM;Left;10 reps;Supine   PT Diagnosis:    PT Problem List:   PT Treatment Interventions:     PT Goals (current goals can now be found in the care plan section) Acute Rehab PT Goals Patient Stated Goal: to be able to garden  Visit Information  Last PT Received On: 03/23/13 Assistance Needed:  +1 History of Present Illness: LDATHA on 03/22/13    Subjective Data  Patient Stated Goal: to be able to garden   Cognition  Cognition Arousal/Alertness: Awake/alert Behavior During Therapy: WFL for tasks assessed/performed Overall Cognitive Status: Within Functional Limits for tasks assessed    Balance  Balance Balance Assessed: Yes Dynamic Standing Balance Dynamic Standing - Level of Assistance: 4: Min assist (min guard)  End of Session PT - End of Session Activity Tolerance: Patient tolerated treatment well Patient left: in bed (on bed with OT)   GP     Rada Hay 03/23/2013, 12:31 PM

## 2013-03-23 NOTE — Progress Notes (Signed)
   Subjective: 1 Day Post-Op Procedure(s) (LRB): LEFT TOTAL HIP ARTHROPLASTY ANTERIOR APPROACH (Left)   Patient reports pain as mild, pain well controlled. No events throughout the night. Feels they did well with PT yesterday. Discharged home today after PT.  Objective:   VITALS:   Filed Vitals:   03/23/13 0520  BP: 124/78  Pulse: 91  Temp: 99.1 F (37.3 C)  Resp: 14    Neurovascular intact Dorsiflexion/Plantar flexion intact Incision: dressing C/D/I No cellulitis present Compartment soft  LABS  Recent Labs  03/23/13 0443  HGB 11.5*  HCT 33.1*  WBC 11.4*  PLT 214     Recent Labs  03/23/13 0443  NA 135  K 4.1  BUN 12  CREATININE 0.56  GLUCOSE 134*     Assessment/Plan: 1 Day Post-Op Procedure(s) (LRB): LEFT TOTAL HIP ARTHROPLASTY ANTERIOR APPROACH (Left) Foley cath d/c'ed Advance diet Up with therapy D/C IV fluids Discharge home with home health Follow up in 2 weeks at [facility]. Follow up with OLIN,Lyrick Lagrand D in 2 weeks.  Contact information:  [facility] [address], [address] [place] [address] [phone]    Expected ABLA  Treated with iron and will observe  Obese (BMI 30-39.9) Estimated body mass index is 34.11 kg/(m^2) as calculated from the following:   Height as of this encounter: 5\' 5"  (1.651 m).   Weight as of this encounter: 92.987 kg (205 lb). Patient also counseled that weight may inhibit the healing process Patient counseled that losing weight will help with future health issues         Anastasio Auerbach. Tamu Golz   PAC  03/23/2013, 8:36 AM

## 2013-03-23 NOTE — Care Management Note (Signed)
    Page 1 of 1   03/23/2013     12:33:06 PM   CARE MANAGEMENT NOTE 03/23/2013  Patient:  Sandy Carpenter, Sandy Carpenter   Account Number:  [id number]  Date Initiated:  03/23/2013  Documentation initiated by:  Colleen Can  Subjective/Objective Assessment:   dx left hip OA; total hip replacemnt-anterior approach     Action/Plan:   CM spoke with patient and spouse. Plans are for patient to return to her home in [place] where spouse and other family members will be caregivers. She already has RW and 3n1. Sandy Carpenter will provide [facility] services.   Anticipated DC Date:  03/23/2013   Anticipated DC Plan:  HOME W HOME HEALTH SERVICES      DC Planning Services  CM consult      [facility] Choice  HOME HEALTH   Choice offered to / List presented to:  C-1 Patient        HH arranged  HH-2 PT      [facility] agency  [facility]   Status of service:  Completed, signed off Medicare Important Message given?   (If response is "NO", the following Medicare IM given date fields will be blank) Date Medicare IM given:   Date Additional Medicare IM given:    Discharge Disposition:  HOME W HOME HEALTH SERVICES  Per UR Regulation:    If discussed at Long Length of Stay Meetings, dates discussed:    Comments:  03/23/2013 Colleen Can BSN RN CCM [phone] Sandy Carpenter will provide HHpt services with start of day after discharge. Anticipate d/c today.

## 2013-03-23 NOTE — Evaluation (Signed)
Occupational Therapy Evaluation Patient Details Name: Sandy Carpenter MRN: [id number] DOB: 12/15/1955 Today's Date: 03/23/2013 Time: 4098-1191 OT Time Calculation (min): 25 min  OT Assessment / Plan / Recommendation History of present illness LDATHA on 03/22/13   Clinical Impression   Pt is doing very well and sister was present for session. Pt has all DME and AE. No further OT needed.     OT Assessment  Patient does not need any further OT services    Follow Up Recommendations  No OT follow up;Supervision/Assistance - 24 hour    Barriers to Discharge      Equipment Recommendations  None recommended by OT    Recommendations for Other Services    Frequency       Precautions / Restrictions Precautions Precautions: None Restrictions Weight Bearing Restrictions: No   Pertinent Vitals/Pain 5/10 L hip; reposition, ice    ADL  Eating/Feeding: Independent Where Assessed - Eating/Feeding: Chair Grooming: Wash/dry hands;Min guard Where Assessed - Grooming: Unsupported standing Upper Body Bathing: Chest;Right arm;Left arm;Abdomen;Set up Where Assessed - Upper Body Bathing: Unsupported sitting Lower Body Bathing: Minimal assistance Where Assessed - Lower Body Bathing: Supported sit to stand Upper Body Dressing: Set up Where Assessed - Upper Body Dressing: Unsupported sitting Lower Body Dressing: Minimal assistance Where Assessed - Lower Body Dressing: Supported sit to stand Toilet Transfer: Lobbyist: Raised toilet seat with arms (or 3-in-1 over toilet) Toileting - Clothing Manipulation and Hygiene: Min guard Where Assessed - Engineer, mining and Hygiene: Sit to stand from 3-in-1 or toilet Tub/Shower Transfer: Min guard Tub/Shower Transfer Method:  (step back over ledge) Equipment Used: Long-handled shoe horn;Long-handled sponge;Reacher;Rolling walker;Sock aid ADL Comments: Educated on AE and pt practiced with sock aid to don sock  with supervision. She doffed sock with reacher with supervision. Discussed long shoe horn and sponge as well as reacher to don pants, underwear. Pt has a built in shower seat and will have spouse help with steadying walker to step in and out. Pt needed intermittant verbal cues for safety with walker and stepping all the way back to the commode.    OT Diagnosis:    OT Problem List:   OT Treatment Interventions:     OT Goals(Current goals can be found in the care plan section) Acute Rehab OT Goals Patient Stated Goal: to be able to garden  Visit Information  Last OT Received On: 03/23/13 Assistance Needed: +1 History of Present Illness: LDATHA on 03/22/13       Prior Functioning     Home Living Family/patient expects to be discharged to:: Private residence Living Arrangements: Spouse/significant other Available Help at Discharge: Family Type of Home: House Home Access: Stairs to enter Secretary/administrator of Steps: 1 Entrance Stairs-Rails: None Home Layout: One level Home Equipment: Environmental consultant - 2 wheels;Bedside commode Additional Comments: purchased AE kit Prior Function Level of Independence: Independent Communication Communication: No difficulties         Vision/Perception     Cognition  Cognition Arousal/Alertness: Awake/alert Behavior During Therapy: WFL for tasks assessed/performed Overall Cognitive Status: Within Functional Limits for tasks assessed    Extremity/Trunk Assessment Upper Extremity Assessment Upper Extremity Assessment: Overall WFL for tasks assessed     Mobility Transfers Transfers: Sit to Stand;Stand to Sit Sit to Stand: 4: Min guard;With upper extremity assist;From chair/3-in-1;From bed Stand to Sit: 4: Min guard;With upper extremity assist;To chair/3-in-1 Details for Transfer Assistance: verbal cues for hand placement     Exercise  Balance Balance Balance Assessed: Yes Dynamic Standing Balance Dynamic Standing - Level of  Assistance: 4: Min assist (min guard)   End of Session OT - End of Session Equipment Utilized During Treatment: Rolling walker Activity Tolerance: Patient tolerated treatment well Patient left: in chair;with call bell/phone within reach;with family/visitor present  GO     Lennox Laity [phone] 03/23/2013, 10:08 AM

## 2013-03-24 NOTE — Discharge Summary (Signed)
Physician Discharge Summary  Patient ID: Sandy Carpenter MRN: [id number] DOB/AGE: 55-Nov-1957 55 y.o.  Admit date: 03/22/2013 Discharge date: 03/23/2013   Procedures:  Procedure(s) (LRB): LEFT TOTAL HIP ARTHROPLASTY ANTERIOR APPROACH (Left)  Attending Physician:  Dr. Durene Romans   Admission Diagnoses:   Left hip OA / pain  Discharge Diagnoses:  Principal Problem:   S/P left THA, AA Active Problems:   Expected blood loss anemia   Obese  Past Medical History  Diagnosis Date  . Hypertension   . Diabetes mellitus without complication     diet controlled  . Arthritis   . PONV (postoperative nausea and vomiting) 2011    HPI: Sandy Carpenter, 57 y.o. female, has a history of pain and functional disability in the left hip(s) due to arthritis and patient has failed non-surgical conservative treatments for greater than 12 weeks to include NSAID's and/or analgesics and activity modification. Onset of symptoms was gradual starting 1+ years ago with rapidlly worsening course since that time.The patient noted no past surgery on the left hip(s). Patient currently rates pain in the left hip at 10 out of 10 with activity. Patient has night pain, worsening of pain with activity and weight bearing, trendelenberg gait, pain that interfers with activities of daily living and pain with passive range of motion. Patient has evidence of periarticular osteophytes and joint space narrowing by imaging studies. There is no current active infection. Risks, benefits and expectations were discussed with the patient. Patient understand the risks, benefits and expectations and wishes to proceed with surgery.   PCP: Eartha Inch, MD   Discharged Condition: good  Hospital Course:  Patient underwent the above stated procedure on 03/22/2013. Patient tolerated the procedure well and brought to the recovery room in good condition and subsequently to the floor.  POD #1 BP: 124/78 ; Pulse: 91 ; Temp: 99.1  F (37.3 C) ; Resp: 14  Pt's foley was removed, as well as the hemovac drain removed. IV was changed to a saline lock. Patient reports pain as mild, pain well controlled. No events throughout the night. Feels they did well with PT yesterday. Discharged home today after PT. Neurovascular intact, dorsiflexion/plantar flexion intact, incision: dressing C/D/I, no cellulitis present and compartment soft.   LABS  Basename    HGB  11.5  HCT  33.1    Discharge Exam: General appearance: alert, cooperative and no distress Extremities: Homans sign is negative, no sign of DVT, no edema, redness or tenderness in the calves or thighs and no ulcers, gangrene or trophic changes  Disposition:    Home or Self Care with follow up in 2 weeks   Follow up with OLIN,Sorin Frimpong D in 2 weeks.  Contact information:  [facility] [address] [place] [address] [phone]      Discharge Orders   Future Orders Complete By Expires   Call MD / Call 911  As directed    Comments:     If you experience chest pain or shortness of breath, CALL 911 and be transported to the hospital emergency room.  If you develope a fever above 101 F, pus (white drainage) or increased drainage or redness at the wound, or calf pain, call your surgeon's office.   Change dressing  As directed    Comments:     Maintain surgical dressing for 10-14 days, then replace with 4x4 guaze and tape. Keep the area dry and clean.   Constipation Prevention  As directed  Comments:     Drink plenty of fluids.  Prune juice may be helpful.  You may use a stool softener, such as Colace (over the counter) 100 mg twice a day.  Use MiraLax (over the counter) for constipation as needed.   Diet - low sodium heart healthy  As directed    Discharge instructions  As directed    Comments:     Maintain surgical dressing for 10-14 days, then replace with gauze and tape. Keep the area dry and clean until follow  up. Follow up in 2 weeks at [facility]. Call with any questions or concerns.   Driving restrictions  As directed    Comments:     No driving for 4 weeks   Increase activity slowly as tolerated  As directed    TED hose  As directed    Comments:     Use stockings (TED hose) for 2 weeks on both leg(s).  You may remove them at night for sleeping.   Weight bearing as tolerated  As directed         Medication List         amoxicillin 500 MG capsule  Commonly known as:  AMOXIL  Take 500 mg by mouth. Takes 2000 mg prior to dental appointment  due to joint replacement     aspirin 325 MG EC tablet  Take 1 tablet (325 mg total) by mouth 2 (two) times daily.     calcium-vitamin D 500-200 MG-UNIT per tablet  Commonly known as:  OSCAL WITH D  Take 1 tablet by mouth 2 (two) times daily.     cholecalciferol 1000 UNITS tablet  Commonly known as:  VITAMIN D  Take 1,000 Units by mouth daily. Vitamin d 3 2000 units daily     doxepin 25 MG capsule  Commonly known as:  SINEQUAN  Take 25 mg by mouth at bedtime.     DSS 100 MG Caps  Take 100 mg by mouth 2 (two) times daily.     estradiol 0.075 MG/24HR  Commonly known as:  VIVELLE-DOT  Place 1 patch onto the skin once a week.     ferrous sulfate 325 (65 FE) MG tablet  Take 1 tablet (325 mg total) by mouth 3 (three) times daily after meals.     Fiber Chew  Chew 2 tablets by mouth daily.     HYDROcodone-acetaminophen 7.5-325 MG per tablet  Commonly known as:  NORCO  Take 1-2 tablets by mouth every 4 (four) hours as needed for pain.     Magnesium 250 MG Tabs  Take 250 mg by mouth daily.     methocarbamol 500 MG tablet  Commonly known as:  ROBAXIN  Take 1 tablet (500 mg total) by mouth every 6 (six) hours as needed (muscle spasms).     multivitamin with minerals Tabs tablet  Take 1 tablet by mouth daily.     olmesartan 20 MG tablet  Commonly known as:  BENICAR  Take 20 mg by mouth every morning.     polyethylene  glycol packet  Commonly known as:  MIRALAX / GLYCOLAX  Take 17 g by mouth 2 (two) times daily.     progesterone 200 MG capsule  Commonly known as:  PROMETRIUM  Take 200 mg by mouth as directed. THE FIRST 12 DAYS OF EVERY MONTH     pyridOXINE 100 MG tablet  Commonly known as:  VITAMIN B-6  Take 100 mg by mouth daily.  Signed: Anastasio Auerbach. Lei Dower   PAC  03/24/2013, 5:46 PM

## 2015-07-20 ENCOUNTER — Other Ambulatory Visit: Payer: Self-pay | Admitting: Family Medicine

## 2015-07-20 DIAGNOSIS — R945 Abnormal results of liver function studies: Principal | ICD-10-CM

## 2015-07-20 DIAGNOSIS — R7989 Other specified abnormal findings of blood chemistry: Secondary | ICD-10-CM

## 2015-08-14 ENCOUNTER — Ambulatory Visit
Admission: RE | Admit: 2015-08-14 | Discharge: 2015-08-14 | Disposition: A | Discharge status: Home / Self Care | Payer: BLUE CROSS/BLUE SHIELD | Source: Ambulatory Visit | Attending: Family Medicine | Admitting: Family Medicine

## 2015-08-14 DIAGNOSIS — R7989 Other specified abnormal findings of blood chemistry: Secondary | ICD-10-CM

## 2015-08-14 DIAGNOSIS — R945 Abnormal results of liver function studies: Principal | ICD-10-CM

## 2015-08-26 NOTE — H&P (Signed)
TOTAL HIP ADMISSION H&P  Patient is admitted for right total hip arthroplasty, anterior approach.  Subjective:  Chief Complaint:    Right hip primary OA / pain  HPI: Sandy Carpenter, 60 y.o. female, has a history of pain and functional disability in the right hip(s) due to arthritis and patient has failed non-surgical conservative treatments for greater than 12 weeks to include NSAID's and/or analgesics and activity modification.  Onset of symptoms was gradual starting 1+ years ago with gradually worsening course since that time.The patient noted prior procedures of the hip to include arthroplasty on the left hip on 03/22/2013.  Patient currently rates pain in the right hip at 9 out of 10 with activity. Patient has night pain, worsening of pain with activity and weight bearing, trendelenberg gait, pain that interfers with activities of daily living and pain with passive range of motion. Patient has evidence of periarticular osteophytes and joint space narrowing by imaging studies. This condition presents safety issues increasing the risk of falls.  There is no current active infection.   Risks, benefits and expectations were discussed with the patient.  Risks including but not limited to the risk of anesthesia, blood clots, nerve damage, blood vessel damage, failure of the prosthesis, infection and up to and including death.  Patient understand the risks, benefits and expectations and wishes to proceed with surgery.   PCP: Eartha Inch, MD  D/C Plans:      Home  Post-op Meds:       No Rx given  Tranexamic Acid:      To be given - IV  Decadron:      Is to be given  FYI:     ASA  Norco  Add Phenergan to post-op orders    Patient Active Problem List   Diagnosis Date Noted  . Expected blood loss anemia 03/23/2013  . Obese 03/23/2013  . S/P left THA, AA 03/22/2013   Past Medical History  Diagnosis Date  . Hypertension   . Diabetes mellitus without complication     diet controlled   . Arthritis   . PONV (postoperative nausea and vomiting) 2011    Past Surgical History  Procedure Laterality Date  . Joint replacement Right 2011    partial knee  . Arthroscopic knee surgery Right 1984 and 2009  . Appendectomy  age 35  . Total hip arthroplasty Left 03/22/2013    Procedure: LEFT TOTAL HIP ARTHROPLASTY ANTERIOR APPROACH;  Surgeon: Shelda Pal, MD;  Location: [facility] ORS;  Service: Orthopedics;  Laterality: Left;    No prescriptions prior to admission   No Known Allergies   Social History  Substance Use Topics  . Smoking status: Never Smoker   . Smokeless tobacco: Not on file  . Alcohol Use: Not on file       Review of Systems  Constitutional: Negative.   HENT: Negative.   Eyes: Negative.   Respiratory: Negative.   Cardiovascular: Negative.   Gastrointestinal: Negative.   Genitourinary: Negative.   Musculoskeletal: Positive for joint pain.  Skin: Negative.   Neurological: Negative.   Endo/Heme/Allergies: Negative.   Psychiatric/Behavioral: Negative.     Objective:  Physical Exam  Constitutional: She is oriented to person, place, and time. She appears well-developed.  HENT:  Head: Normocephalic.  Eyes: Pupils are equal, round, and reactive to light.  Neck: Neck supple. No JVD present. No tracheal deviation present. No thyromegaly present.  Cardiovascular: Normal rate, regular rhythm, normal heart sounds and intact distal pulses.  Respiratory: Effort normal and breath sounds normal. No stridor. No respiratory distress. She has no wheezes.  GI: Soft. There is no tenderness. There is no guarding.  Musculoskeletal:       Right hip: She exhibits decreased range of motion, decreased strength, tenderness and bony tenderness. She exhibits no swelling, no deformity and no laceration.  Lymphadenopathy:    She has no cervical adenopathy.  Neurological: She is alert and oriented to person, place, and time. A sensory deficit (occassional tingling i the right LE)  is present.  Skin: Skin is warm and dry.  Psychiatric: She has a normal mood and affect.     Labs:  Estimated body mass index is 34.11 kg/(m^2) as calculated from the following:   Height as of 03/22/13: 5\' 5"  (1.651 m).   Weight as of 03/15/13: 92.987 kg (205 lb).   Imaging Review Plain radiographs demonstrate severe degenerative joint disease of the right hip(s). The bone quality appears to be good for age and reported activity level.  Assessment/Plan:  End stage arthritis, right hip(s)  The patient history, physical examination, clinical judgement of the provider and imaging studies are consistent with end stage degenerative joint disease of the right hip(s) and total hip arthroplasty is deemed medically necessary. The treatment options including medical management, injection therapy, arthroscopy and arthroplasty were discussed at length. The risks and benefits of total hip arthroplasty were presented and reviewed. The risks due to aseptic loosening, infection, stiffness, dislocation/subluxation,  thromboembolic complications and other imponderables were discussed.  The patient acknowledged the explanation, agreed to proceed with the plan and consent was signed. Patient is being admitted for inpatient treatment for surgery, pain control, PT, OT, prophylactic antibiotics, VTE prophylaxis, progressive ambulation and ADL's and discharge planning.The patient is planning to be discharged home with home health services.       Anastasio AuerbachMatthew S. Derrius Furtick   PA-C  08/26/2015, 9:15 PM

## 2015-08-30 ENCOUNTER — Encounter (HOSPITAL_COMMUNITY): Payer: Self-pay

## 2015-08-30 ENCOUNTER — Encounter (HOSPITAL_COMMUNITY)
Admission: RE | Admit: 2015-08-30 | Discharge: 2015-08-30 | Disposition: A | Discharge status: Home / Self Care | Payer: BLUE CROSS/BLUE SHIELD | Source: Ambulatory Visit | Attending: Orthopedic Surgery | Admitting: Orthopedic Surgery

## 2015-08-30 ENCOUNTER — Encounter (INDEPENDENT_AMBULATORY_CARE_PROVIDER_SITE_OTHER): Payer: Self-pay

## 2015-08-30 DIAGNOSIS — M1611 Unilateral primary osteoarthritis, right hip: Secondary | ICD-10-CM | POA: Diagnosis not present

## 2015-08-30 DIAGNOSIS — I10 Essential (primary) hypertension: Secondary | ICD-10-CM | POA: Insufficient documentation

## 2015-08-30 DIAGNOSIS — Z01812 Encounter for preprocedural laboratory examination: Secondary | ICD-10-CM | POA: Diagnosis not present

## 2015-08-30 DIAGNOSIS — Z01818 Encounter for other preprocedural examination: Secondary | ICD-10-CM | POA: Diagnosis not present

## 2015-08-30 DIAGNOSIS — Z0183 Encounter for blood typing: Secondary | ICD-10-CM | POA: Insufficient documentation

## 2015-08-30 HISTORY — DX: Anesthesia of skin: R20.0

## 2015-08-30 HISTORY — DX: Obesity, unspecified: E66.9

## 2015-08-30 HISTORY — DX: Pure hypercholesterolemia, unspecified: E78.00

## 2015-08-30 LAB — CBC
HCT: 43.3 % (ref 36.0–46.0)
Hemoglobin: 14.8 g/dL (ref 12.0–15.0)
MCH: 32.2 pg (ref 26.0–34.0)
MCHC: 34.2 g/dL (ref 30.0–36.0)
MCV: 94.1 fL (ref 78.0–100.0)
PLATELETS: 242 10*3/uL (ref 150–400)
RBC: 4.6 MIL/uL (ref 3.87–5.11)
RDW: 11.6 % (ref 11.5–15.5)
WBC: 8.2 10*3/uL (ref 4.0–10.5)

## 2015-08-30 LAB — BASIC METABOLIC PANEL
Anion gap: 9 (ref 5–15)
BUN: 13 mg/dL (ref 6–20)
CALCIUM: 10 mg/dL (ref 8.9–10.3)
CHLORIDE: 101 mmol/L (ref 101–111)
CO2: 27 mmol/L (ref 22–32)
CREATININE: 0.69 mg/dL (ref 0.44–1.00)
GFR calc non Af Amer: >60 mL/min (ref >60)
Glucose, Bld: 174 mg/dL — ABNORMAL HIGH (ref 65–99)
Potassium: 4.2 mmol/L (ref 3.5–5.1)
SODIUM: 137 mmol/L (ref 135–145)

## 2015-08-30 LAB — URINE MICROSCOPIC-ADD ON
Bacteria, UA: NONE SEEN
WBC UA: NONE SEEN WBC/hpf (ref 0–5)

## 2015-08-30 LAB — TYPE AND SCREEN
ABO/RH(D): O POS
Antibody Screen: NEGATIVE

## 2015-08-30 LAB — URINALYSIS, ROUTINE W REFLEX MICROSCOPIC
Bilirubin Urine: NEGATIVE
Glucose, UA: 250 mg/dL — AB
Ketones, ur: NEGATIVE mg/dL
Leukocytes, UA: NEGATIVE
NITRITE: NEGATIVE
PH: 6 (ref 5.0–8.0)
Protein, ur: NEGATIVE mg/dL
SPECIFIC GRAVITY, URINE: 1.021 (ref 1.005–1.030)

## 2015-08-30 LAB — SURGICAL PCR SCREEN
MRSA, PCR: NEGATIVE
STAPHYLOCOCCUS AUREUS: NEGATIVE

## 2015-08-30 LAB — PROTIME-INR
INR: 1.13 (ref 0.00–1.49)
PROTHROMBIN TIME: 14.2 s (ref 11.6–15.2)

## 2015-08-30 LAB — APTT: APTT: 29 s (ref 24–37)

## 2015-08-30 NOTE — Progress Notes (Addendum)
Clearance note per chart per Dr Hyacinth MeekerMiller 08/28/2015 with H&P

## 2015-08-30 NOTE — Patient Instructions (Addendum)
Chipper HerbShannon D Delage  08/30/2015   Your procedure is scheduled on: Monday September 10, 2015  [facility]  Entrance take [place]  elevators to 3rd floor to  Short Stay Center at 5:15 AM.  Call this number if you have problems the morning of surgery [phone]   Remember: ONLY 1 PERSON MAY GO WITH YOU TO SHORT STAY TO GET  READY MORNING OF YOUR SURGERY.  Do not eat food or drink any liquids After Midnight     Take these medicines the morning of surgery with A SIP OF WATER: NONE DO NOT TAKE ANY DIABETIC MEDICATIONS DAY OF YOUR SURGERY                               You may not have any metal on your body including hair pins and              piercings  Do not wear jewelry, make-up, lotions, powders or perfumes, deodorant             Do not wear nail polish.  Do not shave  48 hours prior to surgery.               Do not bring valuables to the hospital. [facility] IS NOT             RESPONSIBLE   FOR VALUABLES.  Contacts, dentures or bridgework may not be worn into surgery.  Leave suitcase in the car. After surgery it may be brought to your room.              Please read over the following fact sheets you were given:MRSA INFORMATION SHEET; INCENTIVE SPIROMETER; BLOOD TRANSFUSION INFORMATION SHEET _____________________________________________________________________             [facility] Before surgery, you can play an important role.  Because skin is not sterile, your skin needs to be as free of germs as possible.  You can reduce the number of germs on your skin by washing with CHG (chlorahexidine gluconate) soap before surgery.  CHG is an antiseptic cleaner which kills germs and bonds with the skin to continue killing germs even after washing. Please DO NOT use if you have an allergy to CHG or antibacterial soaps.  If your skin becomes reddened/irritated stop using the CHG and inform your nurse when you arrive at Short Stay. Do not  shave (including legs and underarms) for at least 48 hours prior to the first CHG shower.  You may shave your face/neck. Please follow these instructions carefully:  1.  Shower with CHG Soap the night before surgery and the  morning of Surgery.  2.  If you choose to wash your hair, wash your hair first as usual with your  normal  shampoo.  3.  After you shampoo, rinse your hair and body thoroughly to remove the  shampoo.                           4.  Use CHG as you would any other liquid soap.  You can apply chg directly  to the skin and wash                       Gently with a scrungie  or clean washcloth.  5.  Apply the CHG Soap to your body ONLY FROM THE NECK DOWN.   Do not use on face/ open                           Wound or open sores. Avoid contact with eyes, ears mouth and genitals (private parts).                       Wash face,  Genitals (private parts) with your normal soap.             6.  Wash thoroughly, paying special attention to the area where your surgery  will be performed.  7.  Thoroughly rinse your body with warm water from the neck down.  8.  DO NOT shower/wash with your normal soap after using and rinsing off  the CHG Soap.                9.  Pat yourself dry with a clean towel.            10.  Wear clean pajamas.            11.  Place clean sheets on your bed the night of your first shower and do not  sleep with pets. Day of Surgery : Do not apply any lotions/deodorants the morning of surgery.  Please wear clean clothes to the hospital/surgery center.  FAILURE TO FOLLOW THESE INSTRUCTIONS MAY RESULT IN THE CANCELLATION OF YOUR SURGERY PATIENT SIGNATURE_________________________________  NURSE SIGNATURE__________________________________  ________________________________________________________________________    Rogelia Mire  An incentive spirometer is a tool that can help keep your lungs clear and active. This tool measures how well you are filling your  lungs with each breath. Taking long deep breaths may help reverse or decrease the chance of developing breathing (pulmonary) problems (especially infection) following:  A long period of time when you are unable to move or be active. BEFORE THE PROCEDURE   If the spirometer includes an indicator to show your best effort, your nurse or respiratory therapist will set it to a desired goal.  If possible, sit up straight or lean slightly forward. Try not to slouch.  Hold the incentive spirometer in an upright position. INSTRUCTIONS FOR USE   Sit on the edge of your bed if possible, or sit up as far as you can in bed or on a chair.  Hold the incentive spirometer in an upright position.  Breathe out normally.  Place the mouthpiece in your mouth and seal your lips tightly around it.  Breathe in slowly and as deeply as possible, raising the piston or the ball toward the top of the column.  Hold your breath for 3-5 seconds or for as long as possible. Allow the piston or ball to fall to the bottom of the column.  Remove the mouthpiece from your mouth and breathe out normally.  Rest for a few seconds and repeat Steps 1 through 7 at least 10 times every 1-2 hours when you are awake. Take your time and take a few normal breaths between deep breaths.  The spirometer may include an indicator to show your best effort. Use the indicator as a goal to work toward during each repetition.  After each set of 10 deep breaths, practice coughing to be sure your lungs are clear. If you have an incision (the cut made at the time of surgery), support your incision  when coughing by placing a pillow or rolled up towels firmly against it. Once you are able to get out of bed, walk around indoors and cough well. You may stop using the incentive spirometer when instructed by your caregiver.  RISKS AND COMPLICATIONS  Take your time so you do not get dizzy or light-headed.  If you are in pain, you may need to take or  ask for pain medication before doing incentive spirometry. It is harder to take a deep breath if you are having pain. AFTER USE  Rest and breathe slowly and easily.  It can be helpful to keep track of a log of your progress. Your caregiver can provide you with a simple table to help with this. If you are using the spirometer at home, follow these instructions: [place] IF:   You are having difficultly using the spirometer.  You have trouble using the spirometer as often as instructed.  Your pain medication is not giving enough relief while using the spirometer.  You develop fever of 100.5 F (38.1 C) or higher. SEEK IMMEDIATE MEDICAL CARE IF:   You cough up bloody sputum that had not been present before.  You develop fever of 102 F (38.9 C) or greater.  You develop worsening pain at or near the incision site. MAKE SURE YOU:   Understand these instructions.  Will watch your condition.  Will get help right away if you are not doing well or get worse. Document Released: 10/20/2006 Document Revised: 09/01/2011 Document Reviewed: 12/21/2006 ExitCare Patient Information 2014 ExitCare, [place].   ________________________________________________________________________  WHAT IS A BLOOD TRANSFUSION? Blood Transfusion Information  A transfusion is the replacement of blood or some of its parts. Blood is made up of multiple cells which provide different functions.  Red blood cells carry oxygen and are used for blood loss replacement.  White blood cells fight against infection.  Platelets control bleeding.  Plasma helps clot blood.  Other blood products are available for specialized needs, such as hemophilia or other clotting disorders. BEFORE THE TRANSFUSION  Who gives blood for transfusions?   Healthy volunteers who are fully evaluated to make sure their blood is safe. This is blood bank blood. Transfusion therapy is the safest it has ever been in the practice of  medicine. Before blood is taken from a donor, a complete history is taken to make sure that person has no history of diseases nor engages in risky social behavior (examples are intravenous drug use or sexual activity with multiple partners). The donor's travel history is screened to minimize risk of transmitting infections, such as malaria. The donated blood is tested for signs of infectious diseases, such as HIV and hepatitis. The blood is then tested to be sure it is compatible with you in order to minimize the chance of a transfusion reaction. If you or a relative donates blood, this is often done in anticipation of surgery and is not appropriate for emergency situations. It takes many days to process the donated blood. RISKS AND COMPLICATIONS Although transfusion therapy is very safe and saves many lives, the main dangers of transfusion include:   Getting an infectious disease.  Developing a transfusion reaction. This is an allergic reaction to something in the blood you were given. Every precaution is taken to prevent this. The decision to have a blood transfusion has been considered carefully by your caregiver before blood is given. Blood is not given unless the benefits outweigh the risks. AFTER THE TRANSFUSION  Right after  receiving a blood transfusion, you will usually feel much better and more energetic. This is especially true if your red blood cells have gotten low (anemic). The transfusion raises the level of the red blood cells which carry oxygen, and this usually causes an energy increase.  The nurse administering the transfusion will monitor you carefully for complications. HOME CARE INSTRUCTIONS  No special instructions are needed after a transfusion. You may find your energy is better. Speak with your caregiver about any limitations on activity for underlying diseases you may have. SEEK MEDICAL CARE IF:   Your condition is not improving after your transfusion.  You develop  redness or irritation at the intravenous (IV) site. SEEK IMMEDIATE MEDICAL CARE IF:  Any of the following symptoms occur over the next 12 hours:  Shaking chills.  You have a temperature by mouth above 102 F (38.9 C), not controlled by medicine.  Chest, back, or muscle pain.  People around you feel you are not acting correctly or are confused.  Shortness of breath or difficulty breathing.  Dizziness and fainting.  You get a rash or develop hives.  You have a decrease in urine output.  Your urine turns a dark color or changes to pink, red, or brown. Any of the following symptoms occur over the next 10 days:  You have a temperature by mouth above 102 F (38.9 C), not controlled by medicine.  Shortness of breath.  Weakness after normal activity.  The white part of the eye turns yellow (jaundice).  You have a decrease in the amount of urine or are urinating less often.  Your urine turns a dark color or changes to pink, red, or brown. Document Released: 06/06/2000 Document Revised: 09/01/2011 Document Reviewed: 01/24/2008 [facility] Patient Information 2014 [place], [place].  _______________________________________________________________________

## 2015-08-31 LAB — HEMOGLOBIN A1C
HEMOGLOBIN A1C: 7.8 % — AB (ref 4.8–5.6)
Mean Plasma Glucose: 177 mg/dL

## 2015-08-31 NOTE — Progress Notes (Signed)
Urinalysis results in epic per PAT visit 08/30/2015 sent to Dr Charlann Boxerlin

## 2015-09-09 NOTE — Anesthesia Preprocedure Evaluation (Signed)
Anesthesia Evaluation  Patient identified by MRN, date of birth, ID band Patient awake    Reviewed: Allergy & Precautions, H&P , NPO status , Patient's Chart, lab work & pertinent test results  History of Anesthesia Complications (+) PONV  Airway Mallampati: II  TM Distance: >3 FB Neck ROM: Full    Dental no notable dental hx. (+) Dental Advisory Given, Teeth Intact   Pulmonary neg pulmonary ROS,    Pulmonary exam normal breath sounds clear to auscultation       Cardiovascular hypertension, Pt. on medications Normal cardiovascular exam Rhythm:Regular Rate:Normal     Neuro/Psych negative neurological ROS  negative psych ROS   GI/Hepatic negative GI ROS, Neg liver ROS,   Endo/Other  diabetes, Well Controlled, Type 2, Oral Hypoglycemic Agents  Renal/GU negative Renal ROS  negative genitourinary   Musculoskeletal negative musculoskeletal ROS (+)   Abdominal   Peds negative pediatric ROS (+)  Hematology negative hematology ROS (+)   Anesthesia Other Findings   Reproductive/Obstetrics negative OB ROS                             Anesthesia Physical Anesthesia Plan  ASA: III  Anesthesia Plan: Spinal   Post-op Pain Management:    Induction:   Airway Management Planned:   Additional Equipment:   Intra-op Plan:   Post-operative Plan:   Informed Consent:   Plan Discussed with: Surgeon  Anesthesia Plan Comments:         Anesthesia Quick Evaluation

## 2015-09-10 ENCOUNTER — Inpatient Hospital Stay (HOSPITAL_COMMUNITY)
Admission: RE | Admit: 2015-09-10 | Discharge: 2015-09-11 | DRG: 470 | Disposition: A | Discharge status: Home Health | Payer: BLUE CROSS/BLUE SHIELD | Source: Ambulatory Visit | Attending: Orthopedic Surgery | Admitting: Orthopedic Surgery

## 2015-09-10 ENCOUNTER — Inpatient Hospital Stay (HOSPITAL_COMMUNITY): Payer: BLUE CROSS/BLUE SHIELD | Admitting: Anesthesiology

## 2015-09-10 ENCOUNTER — Encounter (HOSPITAL_COMMUNITY)
Admission: RE | Disposition: A | Discharge status: Home Health | Payer: Self-pay | Source: Ambulatory Visit | Attending: Orthopedic Surgery

## 2015-09-10 ENCOUNTER — Inpatient Hospital Stay (HOSPITAL_COMMUNITY): Payer: BLUE CROSS/BLUE SHIELD

## 2015-09-10 ENCOUNTER — Encounter (HOSPITAL_COMMUNITY): Payer: Self-pay | Admitting: *Deleted

## 2015-09-10 DIAGNOSIS — Z96651 Presence of right artificial knee joint: Secondary | ICD-10-CM | POA: Diagnosis present

## 2015-09-10 DIAGNOSIS — Z96642 Presence of left artificial hip joint: Secondary | ICD-10-CM | POA: Diagnosis present

## 2015-09-10 DIAGNOSIS — Z01812 Encounter for preprocedural laboratory examination: Secondary | ICD-10-CM | POA: Diagnosis not present

## 2015-09-10 DIAGNOSIS — Z96649 Presence of unspecified artificial hip joint: Secondary | ICD-10-CM

## 2015-09-10 DIAGNOSIS — I10 Essential (primary) hypertension: Secondary | ICD-10-CM | POA: Diagnosis present

## 2015-09-10 DIAGNOSIS — M1611 Unilateral primary osteoarthritis, right hip: Principal | ICD-10-CM | POA: Diagnosis present

## 2015-09-10 DIAGNOSIS — E119 Type 2 diabetes mellitus without complications: Secondary | ICD-10-CM | POA: Diagnosis present

## 2015-09-10 DIAGNOSIS — M25551 Pain in right hip: Secondary | ICD-10-CM | POA: Diagnosis present

## 2015-09-10 DIAGNOSIS — Z6834 Body mass index (BMI) 34.0-34.9, adult: Secondary | ICD-10-CM

## 2015-09-10 DIAGNOSIS — E669 Obesity, unspecified: Secondary | ICD-10-CM | POA: Diagnosis present

## 2015-09-10 HISTORY — PX: TOTAL HIP ARTHROPLASTY: SHX124

## 2015-09-10 LAB — GLUCOSE, CAPILLARY
GLUCOSE-CAPILLARY: 250 mg/dL — AB (ref 65–99)
Glucose-Capillary: 171 mg/dL — ABNORMAL HIGH (ref 65–99)
Glucose-Capillary: 192 mg/dL — ABNORMAL HIGH (ref 65–99)
Glucose-Capillary: 253 mg/dL — ABNORMAL HIGH (ref 65–99)

## 2015-09-10 SURGERY — ARTHROPLASTY, HIP, TOTAL, ANTERIOR APPROACH
Anesthesia: Spinal | Site: Hip | Laterality: Right

## 2015-09-10 MED ORDER — MIDAZOLAM HCL 2 MG/2ML IJ SOLN
INTRAMUSCULAR | Status: DC | PRN
  Administered 2015-09-10: 2 mg via INTRAVENOUS

## 2015-09-10 MED ORDER — POLYETHYLENE GLYCOL 3350 17 G PO PACK
17.0000 g | PACK | Freq: Two times a day (BID) | ORAL | Status: DC
  Administered 2015-09-10 – 2015-09-11 (×3): 17 g via ORAL

## 2015-09-10 MED ORDER — PROMETHAZINE HCL 12.5 MG PO TABS: 12.5000 mg | ORAL_TABLET | Freq: Four times a day (QID) | ORAL | Status: AC | PRN

## 2015-09-10 MED ORDER — SCOPOLAMINE 1 MG/3DAYS TD PT72
MEDICATED_PATCH | TRANSDERMAL | Status: DC | PRN
  Administered 2015-09-10: 1.5 mg via TRANSDERMAL

## 2015-09-10 MED ORDER — PROGESTERONE MICRONIZED 200 MG PO CAPS: 200.0000 mg | ORAL_CAPSULE | Freq: Every day | ORAL | Status: DC

## 2015-09-10 MED ORDER — FENTANYL CITRATE (PF) 100 MCG/2ML IJ SOLN
INTRAMUSCULAR | Status: DC | PRN
  Administered 2015-09-10: 50 ug via INTRAVENOUS

## 2015-09-10 MED ORDER — CEFAZOLIN SODIUM-DEXTROSE 2-3 GM-% IV SOLR
2.0000 g | Freq: Four times a day (QID) | INTRAVENOUS | Status: AC
  Administered 2015-09-10 (×2): 2 g via INTRAVENOUS
  Filled 2015-09-10 (×2): qty 50

## 2015-09-10 MED ORDER — ASPIRIN EC 325 MG PO TBEC
325.0000 mg | DELAYED_RELEASE_TABLET | Freq: Two times a day (BID) | ORAL | Status: DC
  Administered 2015-09-11: 325 mg via ORAL
  Filled 2015-09-10 (×3): qty 1

## 2015-09-10 MED ORDER — METOCLOPRAMIDE HCL 5 MG/ML IJ SOLN: 5.0000 mg | Freq: Three times a day (TID) | INTRAMUSCULAR | Status: DC | PRN

## 2015-09-10 MED ORDER — SCOPOLAMINE 1 MG/3DAYS TD PT72
MEDICATED_PATCH | TRANSDERMAL | Status: AC
  Filled 2015-09-10: qty 1

## 2015-09-10 MED ORDER — PHENOL 1.4 % MT LIQD
1.0000 | OROMUCOSAL | Status: DC | PRN
  Filled 2015-09-10: qty 177

## 2015-09-10 MED ORDER — PROMETHAZINE HCL 25 MG/ML IJ SOLN: 6.2500 mg | Freq: Four times a day (QID) | INTRAMUSCULAR | Status: DC | PRN

## 2015-09-10 MED ORDER — FENTANYL CITRATE (PF) 100 MCG/2ML IJ SOLN
INTRAMUSCULAR | Status: AC
  Filled 2015-09-10: qty 2

## 2015-09-10 MED ORDER — DOCUSATE SODIUM 100 MG PO CAPS: 100.0000 mg | ORAL_CAPSULE | Freq: Two times a day (BID) | ORAL | Status: AC

## 2015-09-10 MED ORDER — ONDANSETRON HCL 4 MG/2ML IJ SOLN
INTRAMUSCULAR | Status: AC
  Filled 2015-09-10: qty 2

## 2015-09-10 MED ORDER — ATORVASTATIN CALCIUM 10 MG PO TABS
10.0000 mg | ORAL_TABLET | Freq: Every day | ORAL | Status: DC
  Administered 2015-09-10 – 2015-09-11 (×2): 10 mg via ORAL
  Filled 2015-09-10 (×2): qty 1

## 2015-09-10 MED ORDER — FERROUS SULFATE 325 (65 FE) MG PO TABS: 325.0000 mg | ORAL_TABLET | Freq: Three times a day (TID) | ORAL | Status: AC

## 2015-09-10 MED ORDER — PROPOFOL 500 MG/50ML IV EMUL
INTRAVENOUS | Status: DC | PRN
  Administered 2015-09-10: 100 ug/kg/min via INTRAVENOUS

## 2015-09-10 MED ORDER — SODIUM CHLORIDE 0.9 % IR SOLN
Status: DC | PRN
  Administered 2015-09-10: 1000 mL

## 2015-09-10 MED ORDER — METHOCARBAMOL 1000 MG/10ML IJ SOLN
500.0000 mg | Freq: Four times a day (QID) | INTRAMUSCULAR | Status: DC | PRN
  Administered 2015-09-10: 500 mg via INTRAVENOUS
  Filled 2015-09-10 (×2): qty 5

## 2015-09-10 MED ORDER — CEFAZOLIN SODIUM-DEXTROSE 2-3 GM-% IV SOLR
2.0000 g | INTRAVENOUS | Status: AC
  Administered 2015-09-10: 2 g via INTRAVENOUS

## 2015-09-10 MED ORDER — PHENYLEPHRINE HCL 10 MG/ML IJ SOLN
INTRAMUSCULAR | Status: DC | PRN
  Administered 2015-09-10 (×6): 80 ug via INTRAVENOUS
  Administered 2015-09-10: 160 ug via INTRAVENOUS

## 2015-09-10 MED ORDER — ONDANSETRON HCL 4 MG/2ML IJ SOLN: 4.0000 mg | Freq: Four times a day (QID) | INTRAMUSCULAR | Status: DC | PRN

## 2015-09-10 MED ORDER — DOCUSATE SODIUM 100 MG PO CAPS
100.0000 mg | ORAL_CAPSULE | Freq: Two times a day (BID) | ORAL | Status: DC
  Administered 2015-09-10 – 2015-09-11 (×3): 100 mg via ORAL

## 2015-09-10 MED ORDER — DIPHENHYDRAMINE HCL 25 MG PO CAPS: 25.0000 mg | ORAL_CAPSULE | Freq: Four times a day (QID) | ORAL | Status: DC | PRN

## 2015-09-10 MED ORDER — ALUM & MAG HYDROXIDE-SIMETH 200-200-20 MG/5ML PO SUSP: 30.0000 mL | ORAL | Status: DC | PRN

## 2015-09-10 MED ORDER — MENTHOL 3 MG MT LOZG: 1.0000 | LOZENGE | OROMUCOSAL | Status: DC | PRN

## 2015-09-10 MED ORDER — KETAMINE HCL 10 MG/ML IJ SOLN
INTRAMUSCULAR | Status: DC | PRN
  Administered 2015-09-10: 10 mg via INTRAVENOUS
  Administered 2015-09-10: 20 mg via INTRAVENOUS

## 2015-09-10 MED ORDER — ASPIRIN 325 MG PO TBEC: 325.0000 mg | DELAYED_RELEASE_TABLET | Freq: Two times a day (BID) | ORAL | Status: AC

## 2015-09-10 MED ORDER — HYDROMORPHONE HCL 1 MG/ML IJ SOLN: 0.2500 mg | INTRAMUSCULAR | Status: DC | PRN

## 2015-09-10 MED ORDER — LINAGLIPTIN 5 MG PO TABS
5.0000 mg | ORAL_TABLET | Freq: Every day | ORAL | Status: DC
  Administered 2015-09-10 – 2015-09-11 (×2): 5 mg via ORAL
  Filled 2015-09-10 (×2): qty 1

## 2015-09-10 MED ORDER — METFORMIN HCL 500 MG PO TABS
500.0000 mg | ORAL_TABLET | Freq: Two times a day (BID) | ORAL | Status: DC
  Administered 2015-09-10 – 2015-09-11 (×2): 500 mg via ORAL
  Filled 2015-09-10 (×4): qty 1

## 2015-09-10 MED ORDER — POLYETHYLENE GLYCOL 3350 17 G PO PACK: 17.0000 g | PACK | Freq: Two times a day (BID) | ORAL | Status: AC

## 2015-09-10 MED ORDER — DEXAMETHASONE SODIUM PHOSPHATE 10 MG/ML IJ SOLN
10.0000 mg | Freq: Once | INTRAMUSCULAR | Status: AC
  Administered 2015-09-10: 10 mg via INTRAVENOUS

## 2015-09-10 MED ORDER — INSULIN ASPART 100 UNIT/ML ~~LOC~~ SOLN
0.0000 [IU] | Freq: Three times a day (TID) | SUBCUTANEOUS | Status: DC
  Administered 2015-09-10: 5 [IU] via SUBCUTANEOUS
  Administered 2015-09-11: 3 [IU] via SUBCUTANEOUS

## 2015-09-10 MED ORDER — HYDROCODONE-ACETAMINOPHEN 7.5-325 MG PO TABS
1.0000 | ORAL_TABLET | ORAL | Status: DC
Start: 2015-09-10 — End: 2015-09-11
  Administered 2015-09-10: 2 via ORAL
  Administered 2015-09-10: 1 via ORAL
  Administered 2015-09-10 – 2015-09-11 (×2): 2 via ORAL
  Administered 2015-09-11: 1 via ORAL
  Administered 2015-09-11: 2 via ORAL
  Filled 2015-09-10: qty 1
  Filled 2015-09-10 (×4): qty 2
  Filled 2015-09-10: qty 1

## 2015-09-10 MED ORDER — PHENYLEPHRINE 40 MCG/ML (10ML) SYRINGE FOR IV PUSH (FOR BLOOD PRESSURE SUPPORT)
PREFILLED_SYRINGE | INTRAVENOUS | Status: AC
  Filled 2015-09-10: qty 10

## 2015-09-10 MED ORDER — LIDOCAINE HCL (CARDIAC) 20 MG/ML IV SOLN
INTRAVENOUS | Status: AC
  Filled 2015-09-10: qty 5

## 2015-09-10 MED ORDER — BUPIVACAINE HCL (PF) 0.75 % IJ SOLN
INTRAMUSCULAR | Status: DC | PRN
  Administered 2015-09-10: 15 mg via INTRATHECAL

## 2015-09-10 MED ORDER — BISACODYL 10 MG RE SUPP: 10.0000 mg | Freq: Every day | RECTAL | Status: DC | PRN

## 2015-09-10 MED ORDER — MAGNESIUM CITRATE PO SOLN: 1.0000 | Freq: Once | ORAL | Status: DC | PRN

## 2015-09-10 MED ORDER — TRANEXAMIC ACID 1000 MG/10ML IV SOLN
1000.0000 mg | Freq: Once | INTRAVENOUS | Status: AC
  Administered 2015-09-10: 1000 mg via INTRAVENOUS
  Filled 2015-09-10: qty 10

## 2015-09-10 MED ORDER — LACTATED RINGERS IV SOLN
INTRAVENOUS | Status: DC | PRN
  Administered 2015-09-10 (×2): via INTRAVENOUS

## 2015-09-10 MED ORDER — PROMETHAZINE HCL 25 MG PO TABS: 12.5000 mg | ORAL_TABLET | Freq: Four times a day (QID) | ORAL | Status: DC | PRN

## 2015-09-10 MED ORDER — KETAMINE HCL 10 MG/ML IJ SOLN
INTRAMUSCULAR | Status: AC
  Filled 2015-09-10: qty 1

## 2015-09-10 MED ORDER — METHOCARBAMOL 500 MG PO TABS
500.0000 mg | ORAL_TABLET | Freq: Four times a day (QID) | ORAL | Status: DC | PRN
  Administered 2015-09-10 – 2015-09-11 (×2): 500 mg via ORAL
  Filled 2015-09-10 (×2): qty 1

## 2015-09-10 MED ORDER — ROCURONIUM BROMIDE 100 MG/10ML IV SOLN
INTRAVENOUS | Status: AC
  Filled 2015-09-10: qty 1

## 2015-09-10 MED ORDER — HYDROCODONE-ACETAMINOPHEN 7.5-325 MG PO TABS: 1.0000 | ORAL_TABLET | ORAL | Status: AC | PRN

## 2015-09-10 MED ORDER — SODIUM CHLORIDE 0.9 % IV SOLN
100.0000 mL/h | INTRAVENOUS | Status: DC
  Administered 2015-09-10 (×2): 100 mL/h via INTRAVENOUS
  Filled 2015-09-10 (×5): qty 1000

## 2015-09-10 MED ORDER — HYDROMORPHONE HCL 1 MG/ML IJ SOLN: 0.5000 mg | INTRAMUSCULAR | Status: DC | PRN

## 2015-09-10 MED ORDER — PROPOFOL 10 MG/ML IV BOLUS
INTRAVENOUS | Status: AC
  Filled 2015-09-10: qty 80

## 2015-09-10 MED ORDER — IRBESARTAN 150 MG PO TABS
150.0000 mg | ORAL_TABLET | Freq: Every day | ORAL | Status: DC
  Administered 2015-09-10 – 2015-09-11 (×2): 150 mg via ORAL
  Filled 2015-09-10 (×2): qty 1

## 2015-09-10 MED ORDER — METOCLOPRAMIDE HCL 10 MG PO TABS: 5.0000 mg | ORAL_TABLET | Freq: Three times a day (TID) | ORAL | Status: DC | PRN

## 2015-09-10 MED ORDER — MIDAZOLAM HCL 2 MG/2ML IJ SOLN
1.0000 mg | Freq: Once | INTRAMUSCULAR | Status: AC
  Administered 2015-09-10: 1 mg via INTRAVENOUS

## 2015-09-10 MED ORDER — CHLORHEXIDINE GLUCONATE 4 % EX LIQD: 60.0000 mL | Freq: Once | CUTANEOUS | Status: DC

## 2015-09-10 MED ORDER — DOXEPIN HCL 25 MG PO CAPS
25.0000 mg | ORAL_CAPSULE | Freq: Every evening | ORAL | Status: DC | PRN
  Filled 2015-09-10: qty 1

## 2015-09-10 MED ORDER — MIDAZOLAM HCL 2 MG/2ML IJ SOLN
INTRAMUSCULAR | Status: AC
  Filled 2015-09-10: qty 2

## 2015-09-10 MED ORDER — DEXAMETHASONE SODIUM PHOSPHATE 10 MG/ML IJ SOLN
10.0000 mg | Freq: Once | INTRAMUSCULAR | Status: AC
  Administered 2015-09-11: 10 mg via INTRAVENOUS
  Filled 2015-09-10: qty 1

## 2015-09-10 MED ORDER — DEXAMETHASONE SODIUM PHOSPHATE 10 MG/ML IJ SOLN
INTRAMUSCULAR | Status: AC
  Filled 2015-09-10: qty 1

## 2015-09-10 MED ORDER — FERROUS SULFATE 325 (65 FE) MG PO TABS
325.0000 mg | ORAL_TABLET | Freq: Three times a day (TID) | ORAL | Status: DC
  Administered 2015-09-10 – 2015-09-11 (×3): 325 mg via ORAL
  Filled 2015-09-10 (×6): qty 1

## 2015-09-10 MED ORDER — CELECOXIB 200 MG PO CAPS
200.0000 mg | ORAL_CAPSULE | Freq: Two times a day (BID) | ORAL | Status: DC
  Administered 2015-09-10 – 2015-09-11 (×3): 200 mg via ORAL
  Filled 2015-09-10 (×4): qty 1

## 2015-09-10 MED ORDER — CEFAZOLIN SODIUM-DEXTROSE 2-3 GM-% IV SOLR
INTRAVENOUS | Status: AC
  Filled 2015-09-10: qty 50

## 2015-09-10 MED ORDER — LACTATED RINGERS IV SOLN: INTRAVENOUS | Status: DC

## 2015-09-10 MED ORDER — ONDANSETRON HCL 4 MG PO TABS: 4.0000 mg | ORAL_TABLET | Freq: Four times a day (QID) | ORAL | Status: DC | PRN

## 2015-09-10 MED ORDER — METHOCARBAMOL 500 MG PO TABS: 500.0000 mg | ORAL_TABLET | Freq: Four times a day (QID) | ORAL | Status: AC | PRN

## 2015-09-10 SURGICAL SUPPLY — 35 items
BAG DECANTER FOR FLEXI CONT (MISCELLANEOUS) IMPLANT
BAG ZIPLOCK 12X15 (MISCELLANEOUS) IMPLANT
CAPT HIP TOTAL 2 ×3 IMPLANT
CLOTH BEACON ORANGE TIMEOUT ST (SAFETY) ×3 IMPLANT
COVER PERINEAL POST (MISCELLANEOUS) ×3 IMPLANT
DRAPE STERI IOBAN 125X83 (DRAPES) ×3 IMPLANT
DRAPE U-SHAPE 47X51 STRL (DRAPES) ×6 IMPLANT
DRSG AQUACEL AG ADV 3.5X10 (GAUZE/BANDAGES/DRESSINGS) ×3 IMPLANT
DURAPREP 26ML APPLICATOR (WOUND CARE) ×3 IMPLANT
ELECT REM PT RETURN 15FT ADLT (MISCELLANEOUS) IMPLANT
ELECT REM PT RETURN 9FT ADLT (ELECTROSURGICAL) ×3
ELECTRODE REM PT RTRN 9FT ADLT (ELECTROSURGICAL) ×1 IMPLANT
GLOVE BIOGEL M 7.0 STRL (GLOVE) IMPLANT
GLOVE BIOGEL PI IND STRL 7.5 (GLOVE) ×1 IMPLANT
GLOVE BIOGEL PI IND STRL 8.5 (GLOVE) ×1 IMPLANT
GLOVE BIOGEL PI INDICATOR 7.5 (GLOVE) ×2
GLOVE BIOGEL PI INDICATOR 8.5 (GLOVE) ×2
GLOVE ECLIPSE 8.0 STRL XLNG CF (GLOVE) ×6 IMPLANT
GLOVE ORTHO TXT STRL SZ7.5 (GLOVE) ×3 IMPLANT
GOWN STRL REUS W/TWL LRG LVL3 (GOWN DISPOSABLE) ×6 IMPLANT
GOWN STRL REUS W/TWL XL LVL3 (GOWN DISPOSABLE) ×6 IMPLANT
HOLDER FOLEY CATH W/STRAP (MISCELLANEOUS) ×3 IMPLANT
LIQUID BAND (GAUZE/BANDAGES/DRESSINGS) ×3 IMPLANT
PACK ANTERIOR HIP CUSTOM (KITS) ×3 IMPLANT
SAW OSC TIP CART 19.5X105X1.3 (SAW) ×3 IMPLANT
SCREW PINN CAN 6.5X20 (Screw) ×3 IMPLANT
SUT MNCRL AB 4-0 PS2 18 (SUTURE) ×3 IMPLANT
SUT VIC AB 1 CT1 36 (SUTURE) ×9 IMPLANT
SUT VIC AB 2-0 CT1 27 (SUTURE) ×4
SUT VIC AB 2-0 CT1 TAPERPNT 27 (SUTURE) ×2 IMPLANT
SUT VLOC 180 0 24IN GS25 (SUTURE) ×3 IMPLANT
TRAY FOLEY W/METER SILVER 14FR (SET/KITS/TRAYS/PACK) ×3 IMPLANT
TRAY FOLEY W/METER SILVER 16FR (SET/KITS/TRAYS/PACK) IMPLANT
WATER STERILE IRR 1500ML POUR (IV SOLUTION) ×3 IMPLANT
YANKAUER SUCT BULB TIP 10FT TU (MISCELLANEOUS) IMPLANT

## 2015-09-10 NOTE — Op Note (Signed)
NAME:  Sandy Carpenter                ACCOUNT NO.: [id number]      MEDICAL RECORD NO.: [id number]      FACILITY:  [facility]      PHYSICIAN:  Durene Romans D  DATE OF BIRTH:  Sep 26, 1955     DATE OF PROCEDURE:  09/10/2015                                 OPERATIVE REPORT         PREOPERATIVE DIAGNOSIS: Right  hip osteoarthritis.      POSTOPERATIVE DIAGNOSIS:  Right hip osteoarthritis. History of left total hip replacement     PROCEDURE:  Right total hip replacement through an anterior approach   utilizing DePuy THR system, component size 52mm pinnacle cup, a size 36+4 neutral   Altrex liner, a size 4 Hi Tri Lock stem with a 36+1.5 delta ceramic   ball.      SURGEON:  Madlyn Frankel. Charlann Boxer, M.D.      ASSISTANT:  Lanney Gins, PA-C      ANESTHESIA:  Spinal.      SPECIMENS:  None.      COMPLICATIONS:  None.      BLOOD LOSS:  350 cc     DRAINS:  None.      INDICATION OF THE PROCEDURE:  Sandy Carpenter is a 60 y.o. female who had   presented to office for evaluation of right hip pain.  Radiographs revealed   progressive degenerative changes with bone-on-bone   articulation to the  hip joint.  The patient had painful limited range of   motion significantly affecting their overall quality of life.  The patient was failing to    respond to conservative measures, and at this point was ready   to proceed with more definitive measures.  The patient has noted progressive   degenerative changes in his hip, progressive problems and dysfunction   with regarding the hip prior to surgery.  Consent was obtained for   benefit of pain relief.  Specific risk of infection, DVT, component   failure, dislocation, need for revision surgery, as well discussion of   the anterior versus posterior approach were reviewed.  Consent was   obtained for benefit of anterior pain relief through an anterior   approach.      PROCEDURE IN DETAIL:  The patient was brought to operative  theater.   Once adequate anesthesia, preoperative antibiotics, 2gm of Ancef, 1 gm of Tranexamic Acid, and 10 mg of Decadron administered.   The patient was positioned supine on the OSI [place]anna table.  Once adequate   padding of boney process was carried out, we had predraped out the hip, and  used fluoroscopy to confirm orientation of the pelvis and position.      The right hip was then prepped and draped from proximal iliac crest to   mid thigh with shower curtain technique.      Time-out was performed identifying the patient, planned procedure, and   extremity.     An incision was then made 2 cm distal and lateral to the   anterior superior iliac spine extending over the orientation of the   tensor fascia lata muscle and sharp dissection was carried down to the   fascia of the muscle and protractor placed in the soft tissues.  The fascia was then incised.  The muscle belly was identified and swept   laterally and retractor placed along the superior neck.  Following   cauterization of the circumflex vessels and removing some pericapsular   fat, a second cobra retractor was placed on the inferior neck.  A third   retractor was placed on the anterior acetabulum after elevating the   anterior rectus.  A L-capsulotomy was along the line of the   superior neck to the trochanteric fossa, then extended proximally and   distally.  Tag sutures were placed and the retractors were then placed   intracapsular.  We then identified the trochanteric fossa and   orientation of my neck cut, confirmed this radiographically   and then made a neck osteotomy with the femur on traction.  The femoral   head was removed without difficulty or complication.  Traction was let   off and retractors were placed posterior and anterior around the   acetabulum.      The labrum and foveal tissue were debrided.  I began reaming with a 45mm   reamer and reamed up to 51mm reamer with good bony bed preparation and a  52mm   cup was chosen.  The final 52mm Pinnacle cup was then impacted under fluoroscopy  to confirm the depth of penetration and orientation with respect to   abduction.  A hole eliminator was placed.  The final   36+4 neutral Altrex liner was impacted with good visualized rim fit.  The cup was positioned anatomically within the acetabular portion of the pelvis.      At this point, the femur was rolled at 80 degrees.  Further capsule was   released off the inferior aspect of the femoral neck.  I then   released the superior capsule proximally.  The hook was placed laterally   along the femur and elevated manually and held in position with the bed   hook.  The leg was then extended and adducted with the leg rolled to 100   degrees of external rotation.  Once the proximal femur was fully   exposed, I used a box osteotome to set orientation.  I then began   broaching with the starting chili pepper broach and passed this by hand and then broached up to 4.  With the 4 broach in place I chose a high offset neck and did several trial reductions as the other hip was fit with a 3 Hi stem.  The offset was appropriate, leg lengths   appeared to be best matched to the other side using the +1.5 head ball, confirmed radiographically.   Given these findings, I went ahead and dislocated the hip, repositioned all   retractors and positioned the right hip in the extended and abducted position.  The final 4 Hi Tri Lock stem was   chosen and it was impacted down to the level of neck cut.  Based on this   and the trial reduction, a 36+1.5 delta ceramic ball was chosen and   impacted onto a clean and dry trunnion, and the hip was reduced.  The   hip had been irrigated throughout the case again at this point.  I did   reapproximate the superior capsular leaflet to the anterior leaflet   using #1 Vicryl.  The fascia of the   tensor fascia lata muscle was then reapproximated using #1 Vicryl and #0 V-lock sutures.  The    remaining wound was closed with 2-0  Vicryl and running 4-0 Monocryl.   The hip was cleaned, dried, and dressed sterilely using Dermabond and   Aquacel dressing.  She was then brought   to recovery room in stable condition tolerating the procedure well.    Lanney Gins, PA-C was present for the entirety of the case involved from   preoperative positioning, perioperative retractor management, general   facilitation of the case, as well as primary wound closure as assistant.            Madlyn Frankel Charlann Boxer, M.D.        09/10/2015 8:49 AM

## 2015-09-10 NOTE — Interval H&P Note (Signed)
History and Physical Interval Note:  09/10/2015 7:09 AM  Sandy Carpenter  has presented today for surgery, with the diagnosis of right hip osteoarthritis  The various methods of treatment have been discussed with the patient and family. After consideration of risks, benefits and other options for treatment, the patient has consented to  Procedure(s): RIGHT TOTAL HIP ARTHROPLASTY ANTERIOR APPROACH (Right) as a surgical intervention .  The patient's history has been reviewed, patient examined, no change in status, stable for surgery.  I have reviewed the patient's chart and labs.  Questions were answered to the patient's satisfaction.     Sandy Carpenter,Ardenia Stiner D

## 2015-09-10 NOTE — Anesthesia Postprocedure Evaluation (Signed)
Anesthesia Post Note  Patient: Sandy Carpenter  Procedure(s) Performed: Procedure(s) (LRB): RIGHT TOTAL HIP ARTHROPLASTY ANTERIOR APPROACH (Right)  Patient location during evaluation: PACU Anesthesia Type: Spinal Level of consciousness: awake and alert Pain management: pain level controlled Vital Signs Assessment: post-procedure vital signs reviewed and stable Respiratory status: spontaneous breathing, nonlabored ventilation, respiratory function stable and patient connected to nasal cannula oxygen Cardiovascular status: blood pressure returned to baseline and stable Postop Assessment: no signs of nausea or vomiting Anesthetic complications: no    Last Vitals:  Filed Vitals:   09/10/15 1045 09/10/15 1100  BP: 108/70 114/75  Pulse: 74 78  Temp:    Resp: 12 7    Last Pain:  Filed Vitals:   09/10/15 1102  PainSc: 0-No pain                 Lennox Dolberry L

## 2015-09-10 NOTE — Anesthesia Procedure Notes (Signed)
Spinal Patient location during procedure: OR Start time: 09/10/2015 7:20 AM End time: 09/10/2015 7:27 AM Staffing Anesthesiologist: Rod Mae Performed by: anesthesiologist  Preanesthetic Checklist Completed: patient identified, site marked, surgical consent, pre-op evaluation, timeout performed, IV checked, risks and benefits discussed and monitors and equipment checked Spinal Block Patient position: sitting Prep: Betadine Patient monitoring: heart rate, continuous pulse ox and blood pressure Approach: midline Location: L3-4 Injection technique: single-shot Needle Needle type: Sprotte  Needle gauge: 24 G Needle length: 9 cm Assessment Sensory level: T6 Additional Notes Expiration date of kit checked and confirmed. Patient tolerated procedure well, without complications.

## 2015-09-10 NOTE — Transfer of Care (Signed)
Immediate Anesthesia Transfer of Care Note  Patient: Chipper HerbShannon D Wirz  Procedure(s) Performed: Procedure(s): RIGHT TOTAL HIP ARTHROPLASTY ANTERIOR APPROACH (Right)  Patient Location: PACU  Anesthesia Type:Spinal  Level of Consciousness: alert  and oriented  Airway & Oxygen Therapy: Patient connected to face mask oxygen  Post-op Assessment: Post -op Vital signs reviewed and stable  Post vital signs: stable  Last Vitals:  Filed Vitals:   09/10/15 0518 09/10/15 0917  BP: 148/81 100/63  Pulse: 95 84  Temp: 36.8 C 36.6 C  Resp: 18 16    Complications: No apparent anesthesia complications

## 2015-09-10 NOTE — Discharge Instructions (Signed)

## 2015-09-10 NOTE — Evaluation (Signed)
Physical Therapy Evaluation Patient Details Name: Sandy Carpenter MRN: [id number] DOB: 04-Dec-1955 Today's Date: 09/10/2015   History of Present Illness  [name] 3/20  Clinical Impression  The patient tolerated ambulating x 50' with minimal c/o pain. [name]. Patient will benefit from PT to address problems listed in the note below.    Follow Up Recommendations No PT follow up;Supervision/Assistance - 24 hour    Equipment Recommendations  None recommended by PT    Recommendations for Other Services       Precautions / Restrictions Precautions Precautions: Fall Restrictions Weight Bearing Restrictions: No      Mobility  Bed Mobility Overal bed mobility: Needs Assistance Bed Mobility: Supine to Sit;Sit to Supine     Supine to sit: Min assist Sit to supine: Min assist   General bed mobility comments: assist with the R leg   Transfers Overall transfer level: Needs assistance Equipment used: Rolling walker (2 wheeled) Transfers: Sit to/from Stand Sit to Stand: Min assist         General transfer comment: cues for technique  Ambulation/Gait Ambulation/Gait assistance: Min assist Ambulation Distance (Feet): 50 Feet Assistive device: Rolling walker (2 wheeled) Gait Pattern/deviations: Step-through pattern     General Gait Details: gait is smoothe and barely antalgic.  Stairs            Wheelchair Mobility    Modified Rankin (Stroke Patients Only)       Balance                                             Pertinent Vitals/Pain Pain Assessment: 0-10 Pain Score: 2  Pain Location: R hip/thigh Pain Descriptors / Indicators: Discomfort;Tightness Pain Intervention(s): Monitored during session;Premedicated before session;Ice applied    Home Living Family/patient expects to be discharged to:: Private residence Living Arrangements: Spouse/significant other Available Help at Discharge: Family Type of Home: House Home  Access: Stairs to enter Entrance Stairs-Rails: None Entrance Stairs-Number of Steps: 2 Home Layout: Multi-level;Able to live on main level with bedroom/bathroom Home Equipment: Toilet riser;Bedside commode;Walker - 2 wheels      Prior Function Level of Independence: Independent               Hand Dominance        Extremity/Trunk Assessment   Upper Extremity Assessment: Overall WFL for tasks assessed           Lower Extremity Assessment: RLE deficits/detail RLE Deficits / Details: advances the leg    Cervical / Trunk Assessment: Normal  Communication   Communication: No difficulties  Cognition Arousal/Alertness: Awake/alert Behavior During Therapy: WFL for tasks assessed/performed Overall Cognitive Status: Within Functional Limits for tasks assessed                      General Comments      Exercises Total Joint Exercises Heel Slides: AAROM;Right;10 reps      Assessment/Plan    PT Assessment Patient needs continued PT services  PT Diagnosis Difficulty walking;Acute pain   PT Problem List Decreased strength;Decreased range of motion;Decreased activity tolerance;Decreased mobility;Decreased safety awareness;Decreased knowledge of use of DME;Decreased knowledge of precautions  PT Treatment Interventions DME instruction;Gait training;Stair training;Functional mobility training;Therapeutic activities;Therapeutic exercise;Patient/family education   PT Goals (Current goals can be found in the Care Plan section) Acute Rehab PT Goals Patient Stated Goal: to go home tomorrow.  PT Goal Formulation: With patient/family Time For Goal Achievement: 09/12/15 Potential to Achieve Goals: Good    Frequency 7X/week   Barriers to discharge        Co-evaluation               End of Session   Activity Tolerance: Patient tolerated treatment well Patient left: in bed;with call bell/phone within reach;with bed alarm set;with family/visitor present Nurse  Communication: Mobility status         Time: 8295-6213 PT Time Calculation (min) (ACUTE ONLY): 20 min   Charges:   PT Evaluation $PT Eval Low Complexity: 1 Procedure     PT G CodesRada Hay 09/10/2015, 5:42 PM Blanchard Kelch PT [phone]

## 2015-09-11 LAB — BASIC METABOLIC PANEL
Anion gap: 9 (ref 5–15)
BUN: 11 mg/dL (ref 6–20)
CHLORIDE: 104 mmol/L (ref 101–111)
CO2: 25 mmol/L (ref 22–32)
Calcium: 8.7 mg/dL — ABNORMAL LOW (ref 8.9–10.3)
Creatinine, Ser: 0.64 mg/dL (ref 0.44–1.00)
GFR calc non Af Amer: >60 mL/min (ref >60)
Glucose, Bld: 183 mg/dL — ABNORMAL HIGH (ref 65–99)
POTASSIUM: 4.3 mmol/L (ref 3.5–5.1)
Sodium: 138 mmol/L (ref 135–145)

## 2015-09-11 LAB — CBC
HEMATOCRIT: 34.6 % — AB (ref 36.0–46.0)
HEMOGLOBIN: 11.8 g/dL — AB (ref 12.0–15.0)
MCH: 32 pg (ref 26.0–34.0)
MCHC: 34.1 g/dL (ref 30.0–36.0)
MCV: 93.8 fL (ref 78.0–100.0)
Platelets: 184 10*3/uL (ref 150–400)
RBC: 3.69 MIL/uL — AB (ref 3.87–5.11)
RDW: 11.5 % (ref 11.5–15.5)
WBC: 11.3 10*3/uL — ABNORMAL HIGH (ref 4.0–10.5)

## 2015-09-11 LAB — GLUCOSE, CAPILLARY: Glucose-Capillary: 195 mg/dL — ABNORMAL HIGH (ref 65–99)

## 2015-09-11 NOTE — Progress Notes (Signed)
RN reviewed discharge instructions with patient and family. All questions answered.   Paperwork and prescriptions.   NT rolled patient down in wheelchair to family car.

## 2015-09-11 NOTE — Progress Notes (Addendum)
     Subjective: 1 Day Post-Op Procedure(s) (LRB): RIGHT TOTAL HIP ARTHROPLASTY ANTERIOR APPROACH (Right)   Patient reports pain as mild, pain controlled.   No events throughout the night.  She has been through this on the other hip and looking forward to getting this hip up to speed quickly.   Ready to be discharged home.  Objective:   VITALS:   Filed Vitals:   09/11/15 0551 09/11/15 0650  BP: 128/68   Pulse: 87 80  Temp: 98.2 F (36.8 C)   Resp: 16 18    Dorsiflexion/Plantar flexion intact Incision: dressing C/D/I No cellulitis present Compartment soft  LABS  Recent Labs  09/11/15 0438  HGB 11.8*  HCT 34.6*  WBC 11.3*  PLT 184     Recent Labs  09/11/15 0438  NA 138  K 4.3  BUN 11  CREATININE 0.64  GLUCOSE 183*     Assessment/Plan: 1 Day Post-Op Procedure(s) (LRB): RIGHT TOTAL HIP ARTHROPLASTY ANTERIOR APPROACH (Right) Foley cath d/c'ed Advance diet Up with therapy D/C IV fluids Discharge home with home health [facility]. Follow up with OLIN,Christina Waldrop D in 2 weeks.  Contact information:  [facility] [address], [address] [place] [id number] [phone]     Obese (BMI 30-39.9) Estimated body mass index is 34.56 kg/(m^2) as calculated from the following:   Height as of this encounter: 5\' 6"  (1.676 m).   Weight as of this encounter: 97.07 kg (214 lb). Patient also counseled that weight may inhibit the healing process Patient counseled that losing weight will help with future health issues     Anastasio AuerbachMatthew S. Iyan Flett   PAC  09/11/2015, 8:02 AM

## 2015-09-11 NOTE — Progress Notes (Signed)
Physical Therapy Treatment Patient Details Name: Sandy Carpenter MRN: [id number] DOB: 06/26/1955 Today's Date: 09/11/2015    History of Present Illness [name] 3/20    PT Comments    Ready for DC.  Follow Up Recommendations  No PT follow up;Supervision/Assistance - 24 hour     Equipment Recommendations       Recommendations for Other Services       Precautions / Restrictions      Mobility  Bed Mobility         Supine to sit: Modified independent (Device/Increase time) Sit to supine: Modified independent (Device/Increase time)      Transfers Overall transfer level: Modified independent     Sit to Stand: Modified independent (Device/Increase time)            Ambulation/Gait Ambulation/Gait assistance: Supervision Ambulation Distance (Feet): 150 Feet Assistive device: Rolling walker (2 wheeled) Gait Pattern/deviations: Step-through pattern         Stairs Stairs: Yes Stairs assistance: Supervision Stair Management: Two rails;Forwards Number of Stairs: 2 General stair comments: verbal cues  Wheelchair Mobility    Modified Rankin (Stroke Patients Only)       Balance                                    Cognition Arousal/Alertness: Awake/alert                          Exercises Total Joint Exercises Ankle Circles/Pumps: AROM;Left;15 reps Quad Sets: AROM;Right;Left;15 reps Short Arc Quad: AROM;Right;15 reps Heel Slides: AROM;Right;15 reps Hip ABduction/ADduction: AROM;Right;15 reps Long Arc Quad: AROM;Right;15 reps    General Comments        Pertinent Vitals/Pain Pain Score: 1  Pain Location: R  thigh Pain Descriptors / Indicators: Tender;Tightness Pain Intervention(s): Monitored during session;Premedicated before session;Ice applied    Home Living                      Prior Function            PT Goals (current goals can now be found in the care plan section) Progress towards PT goals:  Progressing toward goals    Frequency       PT Plan Current plan remains appropriate    Co-evaluation             End of Session   Activity Tolerance: Patient tolerated treatment well Patient left: in chair;with call bell/phone within reach;with family/visitor present     Time: 4098-11910947-1015 PT Time Calculation (min) (ACUTE ONLY): 28 min  Charges:  $Gait Training: 8-22 mins $Therapeutic Exercise: 8-22 mins                    G Codes:      Rada HayHill, Arushi Partridge Elizabeth 09/11/2015, 2:15 PM

## 2015-09-11 NOTE — Care Management Note (Addendum)
Case Management Note  Patient Details  Name: Chipper HerbShannon D Michaelson MRN: [id number] Date of Birth: 1956/06/18  Subjective/Objective:                  RIGHT TOTAL HIP ARTHROPLASTY ANTERIOR APPROACH (Right) Action/Plan: Discharge planning Expected Discharge Date:  09/11/15               Expected Discharge Plan:  Home w Home Health Services  In-House Referral:     Discharge planning Services  CM Consult  Post Acute Care Choice:  Home Health Choice offered to:  Patient  DME Arranged:  N/A DME Agency:  NA  HH Arranged:  Patient Refused HH Agency:  NA  Status of Service:  Completed, signed off  Medicare Important Message Given:    Date Medicare IM Given:    Medicare IM give by:    Date Additional Medicare IM Given:    Additional Medicare Important Message give by:     If discussed at Long Length of Stay Meetings, dates discussed:    Additional Comments: Utilization Review complete. CM confirms pt has denied home health services and has no additional DME needs.  No other CM needs were communicated. Yves DillJeffries, Junior Huezo Christine, RN 09/11/2015, 10:12 AM

## 2015-09-11 NOTE — Discharge Summary (Signed)
Physician Discharge Summary  Patient ID: AMELA HANDLEY MRN: [id number] DOB/AGE: 28-May-1956 60 y.o.  Admit date: 09/10/2015 Discharge date: 09/11/2015   Procedures:  Procedure(s) (LRB): RIGHT TOTAL HIP ARTHROPLASTY ANTERIOR APPROACH (Right)  Attending Physician:  Dr. Durene Romans   Admission Diagnoses:   Right hip primary OA / pain  Discharge Diagnoses:  Principal Problem:   S/P right THA, AA Active Problems:   Obese  Past Medical History  Diagnosis Date  . Hypertension   . Diabetes mellitus without complication (HCC)     diet controlled  . Arthritis   . PONV (postoperative nausea and vomiting) 2011  . Numbness     bilat more in right foot   . Obesity   . Hypercholesterolemia     HPI:    Sandy Carpenter, 60 y.o. female, has a history of pain and functional disability in the right hip(s) due to arthritis and patient has failed non-surgical conservative treatments for greater than 12 weeks to include NSAID's and/or analgesics and activity modification. Onset of symptoms was gradual starting 1+ years ago with gradually worsening course since that time.The patient noted prior procedures of the hip to include arthroplasty on the left hip on 03/22/2013. Patient currently rates pain in the right hip at 9 out of 10 with activity. Patient has night pain, worsening of pain with activity and weight bearing, trendelenberg gait, pain that interfers with activities of daily living and pain with passive range of motion. Patient has evidence of periarticular osteophytes and joint space narrowing by imaging studies. This condition presents safety issues increasing the risk of falls. There is no current active infection. Risks, benefits and expectations were discussed with the patient. Risks including but not limited to the risk of anesthesia, blood clots, nerve damage, blood vessel damage, failure of the prosthesis, infection and up to and including death. Patient understand the  risks, benefits and expectations and wishes to proceed with surgery.   PCP: Neldon Labella, MD   Discharged Condition: good  Hospital Course:  Patient underwent the above stated procedure on 09/10/2015. Patient tolerated the procedure well and brought to the recovery room in good condition and subsequently to the floor.  POD #1 BP: 128/68 ; Pulse: 80 ; Temp: 98.2 F (36.8 C) ; Resp: 18 Patient reports pain as mild, pain controlled. No events throughout the night. She has been through this on the other hip and looking forward to getting this hip up to speed quickly. Ready to be discharged home. Dorsiflexion/plantar flexion intact, incision: dressing C/D/I, no cellulitis present and compartment soft.   LABS  Basename    HGB     11.8  HCT     34.6    Discharge Exam: General appearance: alert, cooperative and no distress Extremities: Homans sign is negative, no sign of DVT, no edema, redness or tenderness in the calves or thighs and no ulcers, gangrene or trophic changes  Disposition: Home with follow up in 2 weeks   Follow-up Information    Follow up with Shelda Pal, MD. Schedule an appointment as soon as possible for a visit in 2 weeks.   Specialty:  Orthopedic Surgery   Contact information:   [address] [place] [address] [phone]       Discharge Instructions    Call MD / Call 911    Complete by:  As directed   If you experience chest pain or shortness of breath, CALL 911 and be transported to the hospital emergency  room.  If you develope a fever above 101 F, pus (white drainage) or increased drainage or redness at the wound, or calf pain, call your surgeon's office.     Change dressing    Complete by:  As directed   Maintain surgical dressing until follow up in the clinic. If the edges start to pull up, may reinforce with tape. If the dressing is no longer working, may remove and cover with gauze and tape, but must keep the area dry and  clean.  Call with any questions or concerns.     Constipation Prevention    Complete by:  As directed   Drink plenty of fluids.  Prune juice may be helpful.  You may use a stool softener, such as Colace (over the counter) 100 mg twice a day.  Use MiraLax (over the counter) for constipation as needed.     Diet - low sodium heart healthy    Complete by:  As directed      Discharge instructions    Complete by:  As directed   Maintain surgical dressing until follow up in the clinic. If the edges start to pull up, may reinforce with tape. If the dressing is no longer working, may remove and cover with gauze and tape, but must keep the area dry and clean.  [facility]. Call with any questions or concerns.     Increase activity slowly as tolerated    Complete by:  As directed   Weight bearing as tolerated with assist device (walker, cane, etc) as directed, use it as long as suggested by your surgeon or therapist, typically at least 4-6 weeks.     TED hose    Complete by:  As directed   Use stockings (TED hose) for 2 weeks on both leg(s).  You may remove them at night for sleeping.             Medication List    STOP taking these medications        naproxen sodium 220 MG tablet  Commonly known as:  ANAPROX      TAKE these medications        amoxicillin 500 MG capsule  Commonly known as:  AMOXIL  Take 500 mg by mouth. Takes 2000 mg prior to dental appointment  due to joint replacement     aspirin 325 MG EC tablet  Take 1 tablet (325 mg total) by mouth 2 (two) times daily.     atorvastatin 10 MG tablet  Commonly known as:  LIPITOR  Take 10 mg by mouth daily.     BLACK COHOSH PO  Take 1 tablet by mouth daily.     calcium-vitamin D 500-200 MG-UNIT tablet  Commonly known as:  OSCAL WITH D  Take 1 tablet by mouth 2 (two) times daily.     cholecalciferol 1000 units tablet  Commonly known as:  VITAMIN D  Take 1,000 Units by mouth daily. Vitamin d  3 2000 units daily     docusate sodium 100 MG capsule  Commonly known as:  COLACE  Take 1 capsule (100 mg total) by mouth 2 (two) times daily.     doxepin 25 MG capsule  Commonly known as:  SINEQUAN  Take 25 mg by mouth at bedtime as needed (Sleep).     estradiol 0.1 mg/24hr patch  Commonly known as:  CLIMARA - Dosed in mg/24 hr  Place 0.05 mg onto the skin once a  week.     ferrous sulfate 325 (65 FE) MG tablet  Take 1 tablet (325 mg total) by mouth 3 (three) times daily after meals.     Fiber Chew  Chew 2 tablets by mouth daily as needed (Constipation).     HYDROcodone-acetaminophen 7.5-325 MG tablet  Commonly known as:  NORCO  Take 1-2 tablets by mouth every 4 (four) hours as needed for moderate pain.     Magnesium 250 MG Tabs  Take 250 mg by mouth daily.     metFORMIN 500 MG tablet  Commonly known as:  GLUCOPHAGE  Take 500 mg by mouth 2 (two) times daily with a meal.     methocarbamol 500 MG tablet  Commonly known as:  ROBAXIN  Take 1 tablet (500 mg total) by mouth every 6 (six) hours as needed for muscle spasms.     multivitamin with minerals Tabs tablet  Take 1 tablet by mouth daily.     olmesartan 20 MG tablet  Commonly known as:  BENICAR  Take 20 mg by mouth every morning.     polyethylene glycol packet  Commonly known as:  MIRALAX / GLYCOLAX  Take 17 g by mouth 2 (two) times daily.     progesterone 200 MG capsule  Commonly known as:  PROMETRIUM  Take 200 mg by mouth as directed. THE FIRST 14 DAYS OF EVERY MONTH     promethazine 12.5 MG tablet  Commonly known as:  PHENERGAN  Take 1 tablet (12.5 mg total) by mouth every 6 (six) hours as needed for nausea.     pyridOXINE 100 MG tablet  Commonly known as:  VITAMIN B-6  Take 100 mg by mouth daily.     sitaGLIPtin 100 MG tablet  Commonly known as:  JANUVIA  Take 100 mg by mouth daily.         Signed: Anastasio Auerbach. Seiji Wiswell   PA-C  09/11/2015, 9:37 PM

## 2015-09-11 NOTE — Progress Notes (Signed)
Occupational Therapy Evaluation Patient Details Name: Sandy Carpenter MRN: [id number] DOB: Jul 22, 1955 Today's Date: 09/11/2015    History of Present Illness [name] 3/20   Clinical Impression   All education completed and pt questions answered. No further OT needs. Will sign off.    Follow Up Recommendations  No OT follow up;Supervision/Assistance -- 24 hour   Equipment Recommendations  None recommended by OT    Recommendations for Other Services       Precautions / Restrictions Precautions Precautions: Fall Restrictions Weight Bearing Restrictions: No Other Position/Activity Restrictions: WBAT      Mobility Bed Mobility Overal bed mobility: Modified Independent Bed Mobility: Supine to Sit              Transfers Overall transfer level: Needs assistance Equipment used: Rolling walker (2 wheeled) Transfers: Sit to/from Stand Sit to Stand: Supervision              Balance                                            ADL Overall ADL's : Needs assistance/impaired Eating/Feeding: Independent;Sitting   Grooming: Wash/dry hands;Oral care;Supervision/safety;Standing   Upper Body Bathing: Set up;Sitting   Lower Body Bathing: Supervison/ safety;Sit to/from stand   Upper Body Dressing : Set up;Sitting   Lower Body Dressing: Supervision/safety;Sit to/from stand   Toilet Transfer: Supervision/safety;Ambulation;Regular Toilet;BSC;RW   Toileting- Clothing Manipulation and Hygiene: Supervision/safety;Sit to/from stand   Tub/ Shower Transfer: Walk-in shower;Supervision/safety;Ambulation;Shower seat;Rolling walker   Functional mobility during ADLs: Supervision/safety;Rolling walker General ADL Comments: Patient familiar with OT techniques from prior hip surgery but desires a review. Patient practiced getting dressed after educated about LB dressing tehcniques, ambulation with RW to bathroom, toilet transfer and toileting, walk-in shower  transfer. Up to recliner at end of session. All questions answered.     Vision     Perception     Praxis      Pertinent Vitals/Pain Pain Assessment: 0-10 Pain Score: 2  Pain Location: R hip/thigh Pain Descriptors / Indicators: Discomfort;Tightness Pain Intervention(s): Monitored during session;Repositioned;Ice applied     Hand Dominance Right   Extremity/Trunk Assessment Upper Extremity Assessment Upper Extremity Assessment: Overall WFL for tasks assessed   Lower Extremity Assessment Lower Extremity Assessment: Defer to PT evaluation   Cervical / Trunk Assessment Cervical / Trunk Assessment: Normal   Communication Communication Communication: No difficulties   Cognition Arousal/Alertness: Awake/alert Behavior During Therapy: WFL for tasks assessed/performed Overall Cognitive Status: Within Functional Limits for tasks assessed                     General Comments       Exercises       Shoulder Instructions      Home Living Family/patient expects to be discharged to:: Private residence Living Arrangements: Spouse/significant other Available Help at Discharge: Family Type of Home: House Home Access: Stairs to enter Secretary/administratorntrance Stairs-Number of Steps: 2 Entrance Stairs-Rails: None Home Layout: Multi-level;Able to live on main level with bedroom/bathroom     Bathroom Shower/Tub: Producer, television/film/videoWalk-in shower   Bathroom Toilet: Standard Bathroom Accessibility: Yes How Accessible: Accessible via walker Home Equipment: Toilet riser;Bedside commode;Walker - 2 wheels;Shower seat          Prior Functioning/Environment Level of Independence: Independent             OT Diagnosis: Acute pain  OT Problem List: Decreased strength;Decreased range of motion;Decreased knowledge of use of DME or AE;Pain   OT Treatment/Interventions:      OT Goals(Current goals can be found in the care plan section) Acute Rehab OT Goals Patient Stated Goal: home today OT Goal  Formulation: All assessment and education complete, DC therapy  OT Frequency:     Barriers to D/C:            Co-evaluation              End of Session Equipment Utilized During Treatment: Rolling walker  Activity Tolerance: Patient tolerated treatment well Patient left: in chair;with call bell/phone within reach   Time: 4098-1191 OT Time Calculation (min): 20 min Charges:  OT General Charges $OT Visit: 1 Procedure OT Evaluation $OT Eval Low Complexity: 1 Procedure G-Codes:    Taneia Mealor A 10-10-2015, 10:08 AM

## 2015-10-12 ENCOUNTER — Other Ambulatory Visit: Payer: Self-pay | Admitting: Gynecology

## 2015-10-12 DIAGNOSIS — R928 Other abnormal and inconclusive findings on diagnostic imaging of breast: Secondary | ICD-10-CM

## 2015-10-18 ENCOUNTER — Ambulatory Visit
Admission: RE | Admit: 2015-10-18 | Discharge: 2015-10-18 | Disposition: A | Discharge status: Home / Self Care | Payer: BLUE CROSS/BLUE SHIELD | Source: Ambulatory Visit | Attending: Gynecology | Admitting: Gynecology

## 2015-10-18 DIAGNOSIS — R928 Other abnormal and inconclusive findings on diagnostic imaging of breast: Secondary | ICD-10-CM

## 2016-04-08 ENCOUNTER — Other Ambulatory Visit: Payer: Self-pay | Admitting: Gynecology

## 2016-04-08 DIAGNOSIS — N6310 Unspecified lump in the right breast, unspecified quadrant: Secondary | ICD-10-CM

## 2016-04-21 ENCOUNTER — Other Ambulatory Visit: Payer: BLUE CROSS/BLUE SHIELD

## 2016-04-23 ENCOUNTER — Ambulatory Visit
Admission: RE | Admit: 2016-04-23 | Discharge: 2016-04-23 | Disposition: A | Discharge status: Home / Self Care | Payer: BLUE CROSS/BLUE SHIELD | Source: Ambulatory Visit | Attending: Gynecology | Admitting: Gynecology

## 2016-04-23 DIAGNOSIS — N6310 Unspecified lump in the right breast, unspecified quadrant: Secondary | ICD-10-CM

## 2017-02-18 ENCOUNTER — Other Ambulatory Visit: Payer: Self-pay | Admitting: Gynecology

## 2017-02-26 IMAGING — DX DG HIP (WITH OR WITHOUT PELVIS) 1V PORT*R*
2 series · 2 of 2 positions shown · non-contrast
Comparison: None.

CLINICAL DATA: Status post right hip replacement today. Initial
encounter.

EXAM:
DG HIP (WITH OR WITHOUT PELVIS) 1V PORT RIGHT

[pelvis ap]
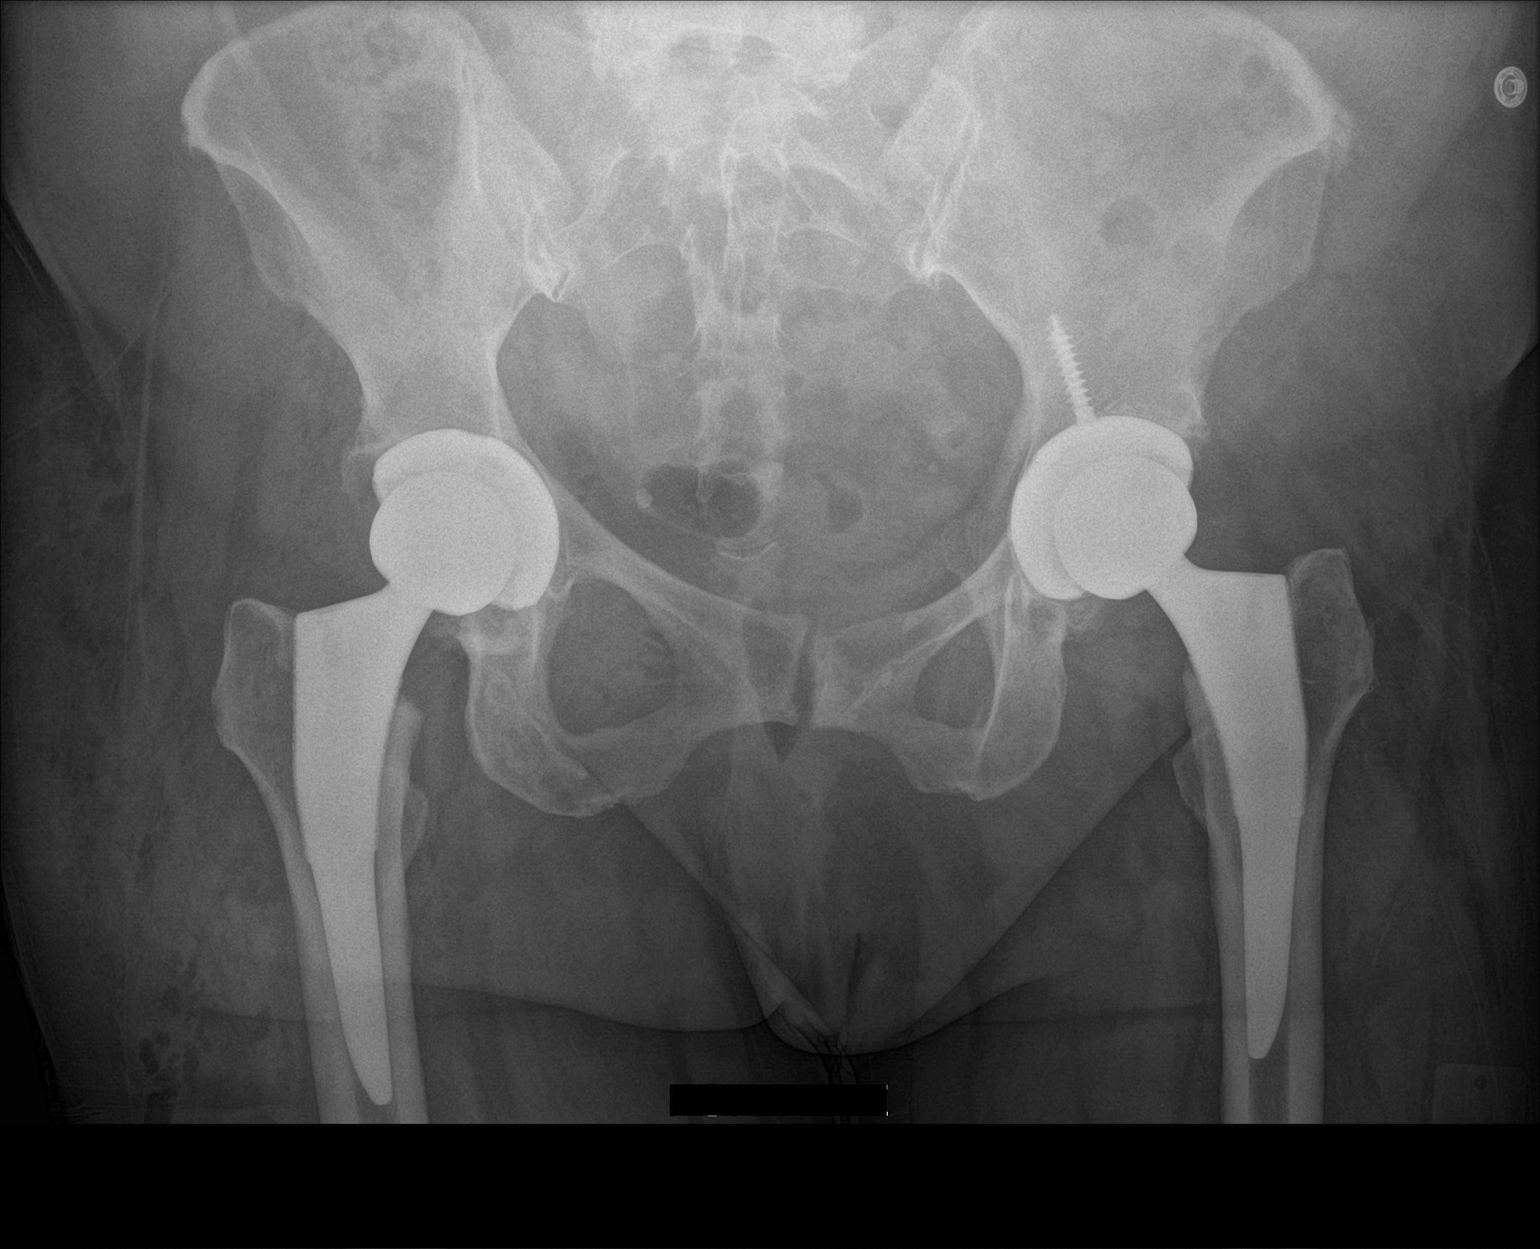

[hip frog leg]
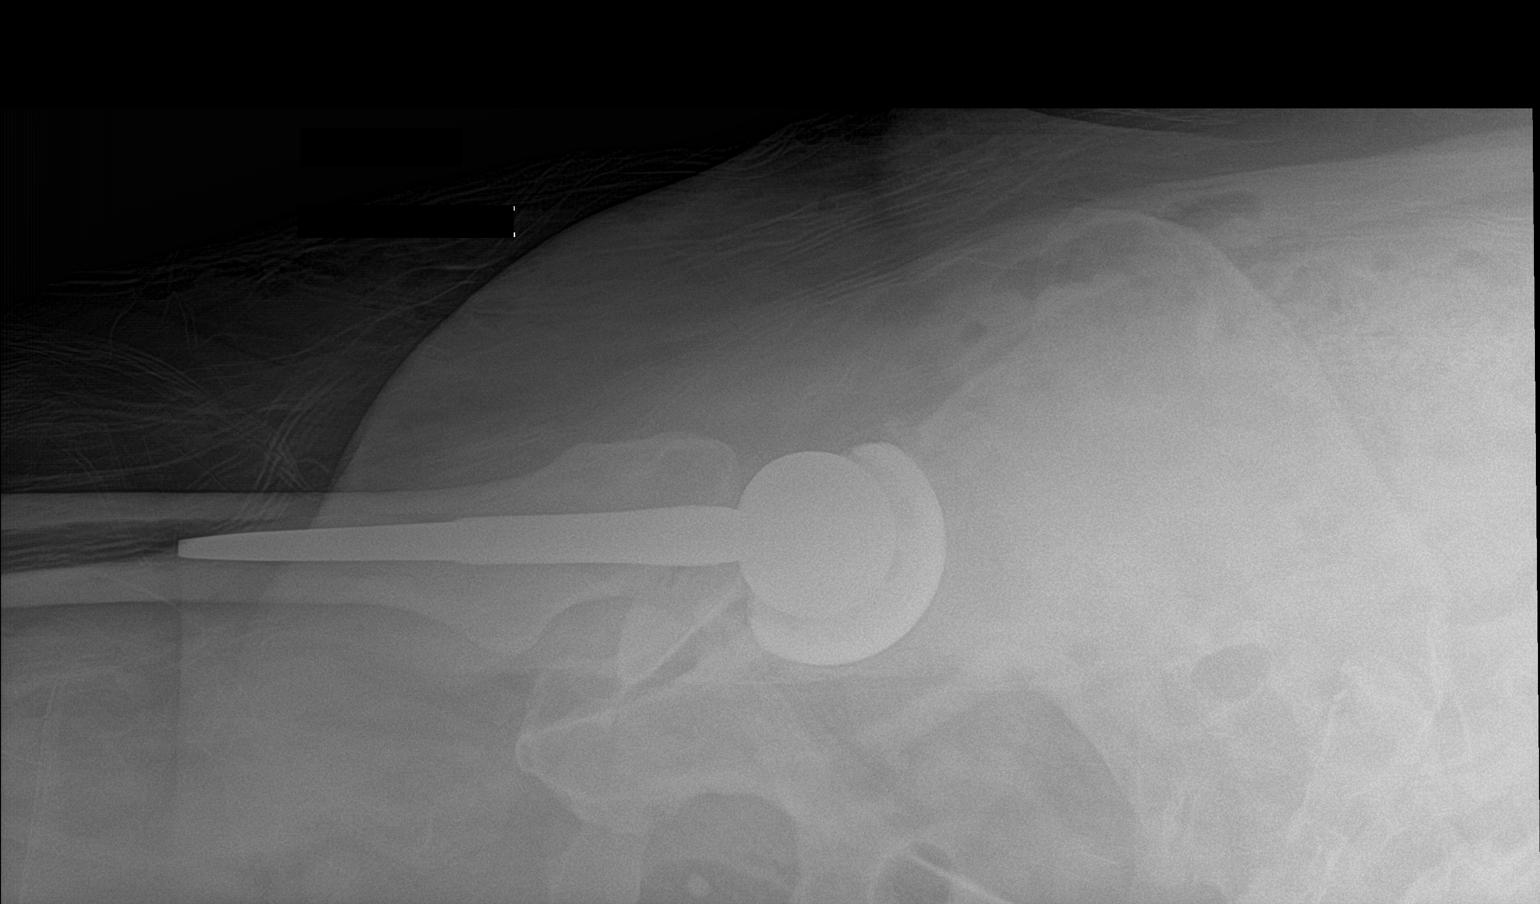

[2 of 2 positions shown; findings below may reference images not displayed]

FINDINGS: Right total hip arthroplasty is in place. The device is located and
there is no fracture. Gas in the soft tissues from surgery is noted.
Left hip replacement is noted.
IMPRESSION: Status post right hip replacement without evidence of complication.
No acute abnormality.

## 2017-10-10 IMAGING — MG 2D DIGITAL DIAGNOSTIC UNILATERAL RIGHT MAMMOGRAM WITH CAD AND AD
7 series · 8 of 15 positions shown · non-contrast
Comparison: Previous exams including diagnostic mammogram and
ultrasound dated 10/18/2015.

CLINICAL DATA: Six-month follow-up for probably benign asymmetry
within the outer right breast.

EXAM:
2D DIGITAL DIAGNOSTIC UNILATERAL RIGHT MAMMOGRAM WITH CAD AND
ADJUNCT TOMO

[R MLO (1 of 2)]
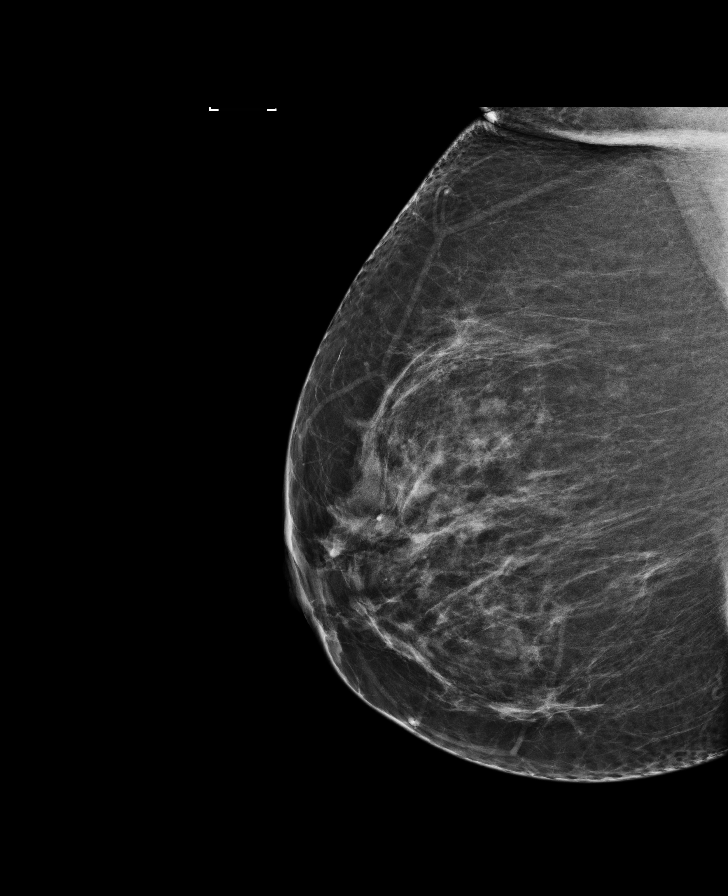

[R MLO (2 of 2)]
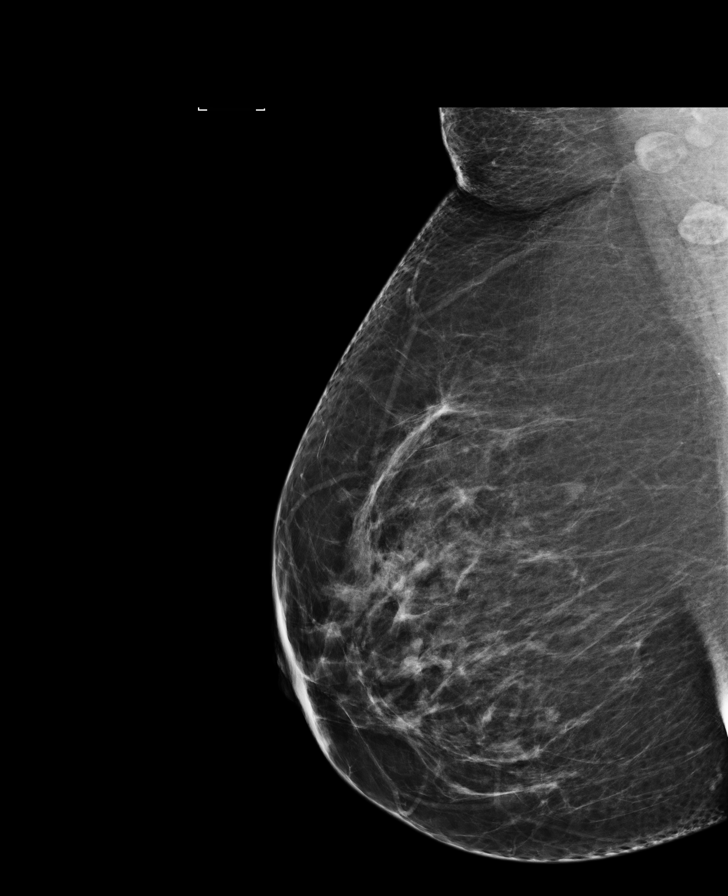

[R MLO synth-2D]
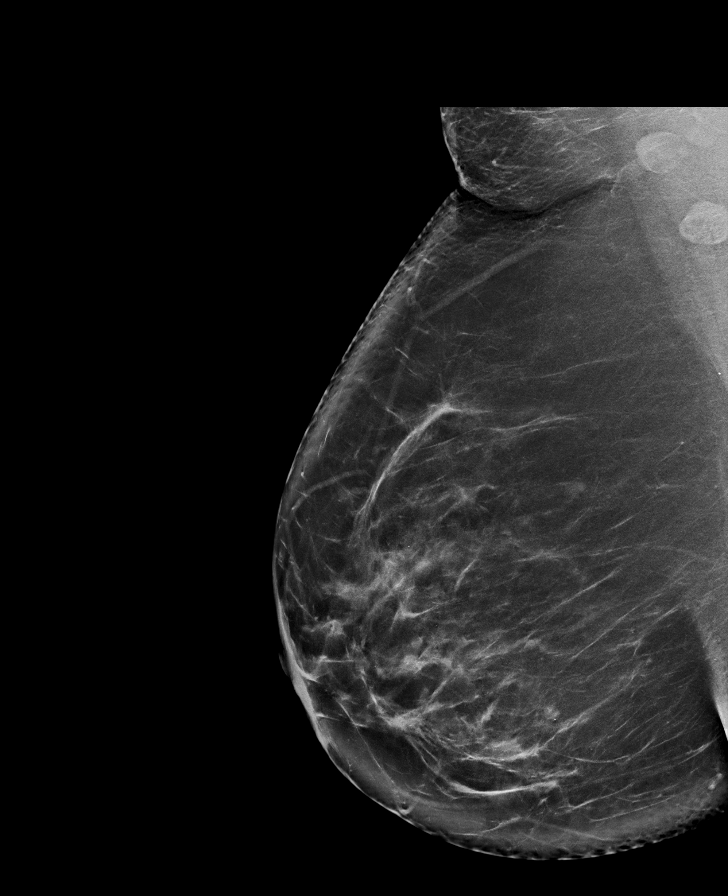

[R CC synth-2D]
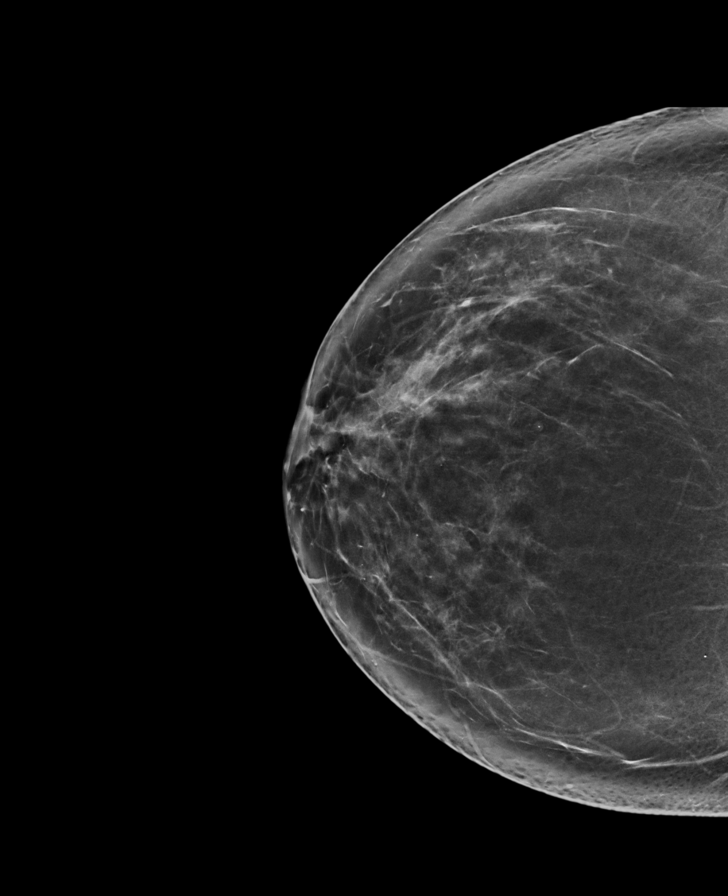

[R CC]
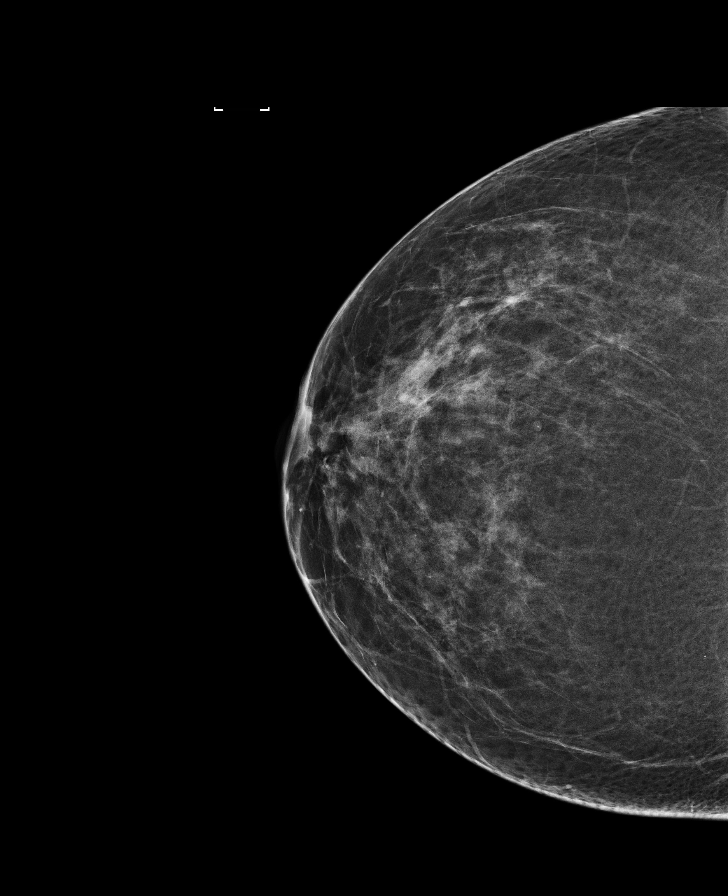

[R CC tomo · 2 of 91 frames shown]
[frame 30/91]
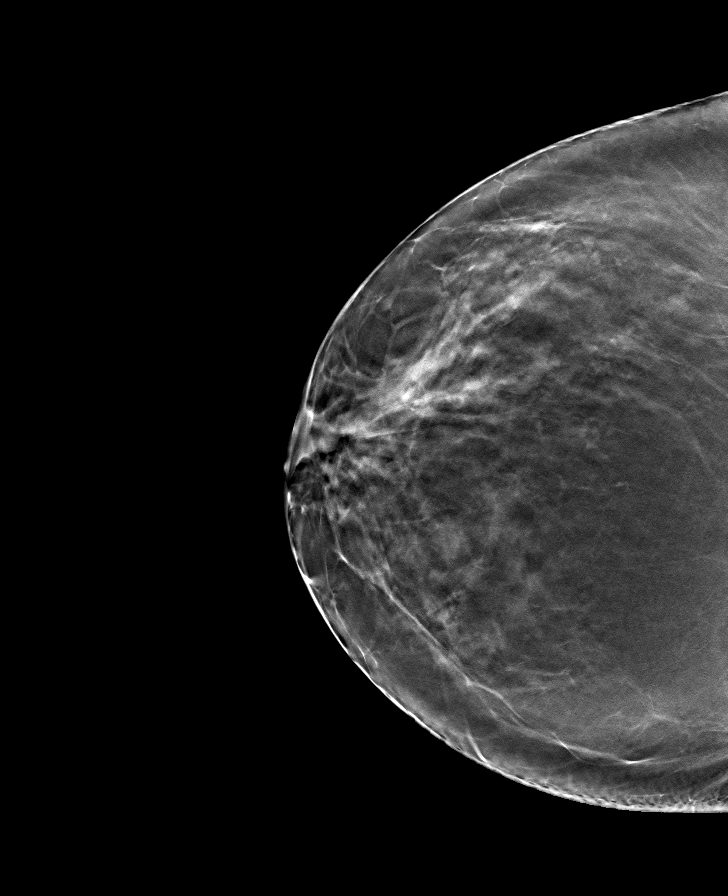
[frame 46/91]
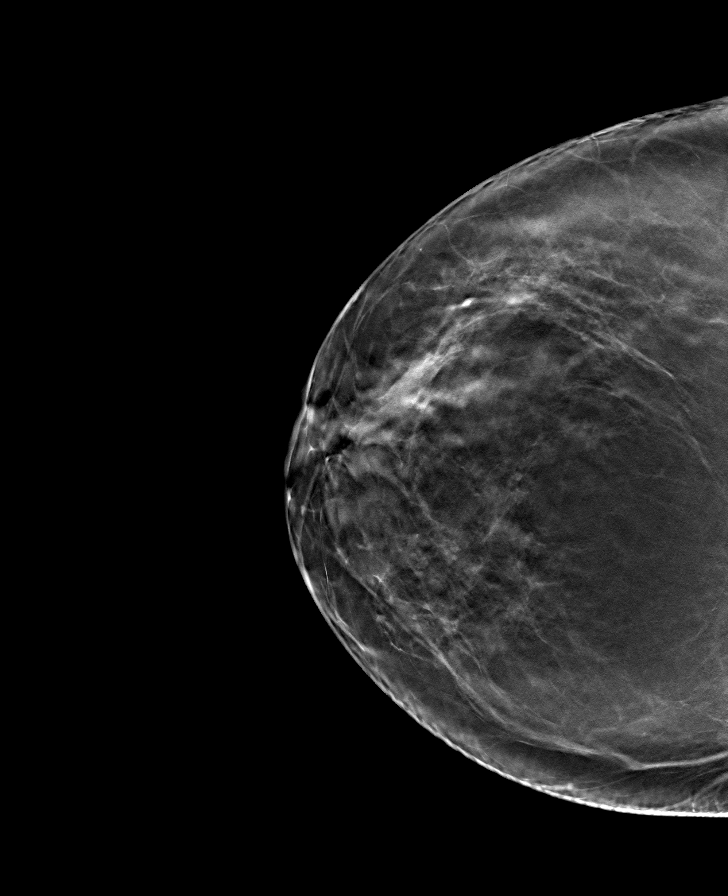

[R MLO tomo · tomo slice 53/105.0]
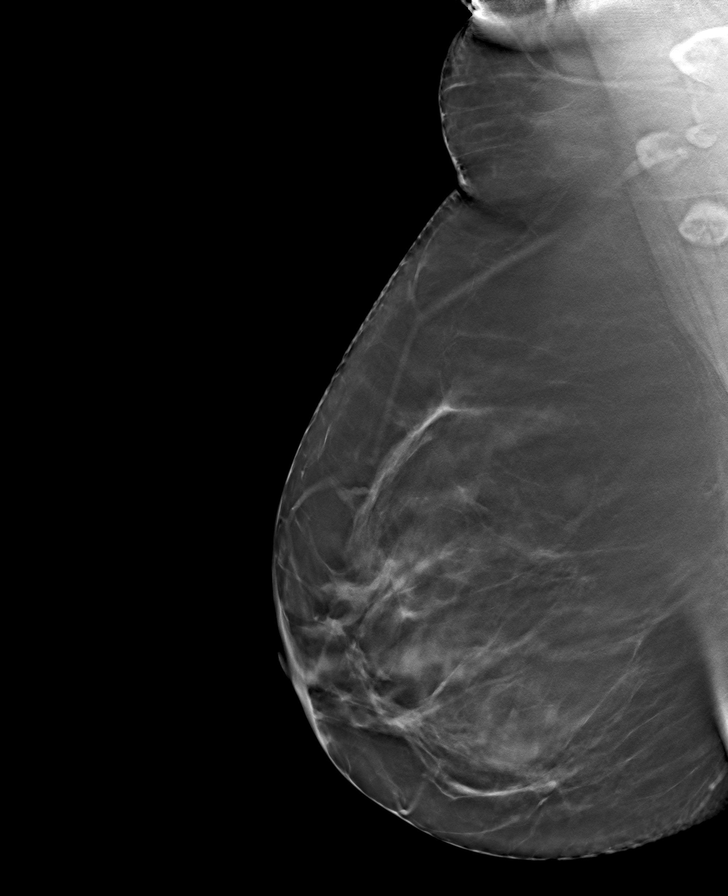

[8 of 15 positions shown; findings below may reference images not displayed]

ACR Breast Density Category b: There are scattered areas of
fibroglandular density.
FINDINGS: The previously described asymmetry within the outer right breast has
an appearance compatible with benign intramammary lymph node on
today's exam, corresponding to the benign lymph node demonstrated on
earlier ultrasound, slightly decreased in size compared to the
previous study indicating benignity.

There are no new dominant masses, suspicious calcifications or
secondary signs of malignancy within the right breast.

Mammographic images were processed with CAD.
IMPRESSION: No evidence of malignancy.

Patient may return to routine annual bilateral screening mammogram
schedule, to commence in 6 months in conjunction with patient's
routine left breast screening mammogram schedule.

RECOMMENDATION:
Bilateral screening mammogram in 6 months.

I have discussed the findings and recommendations with the patient.
Results were also provided in writing at the conclusion of the
visit. If applicable, a reminder letter will be sent to the patient
regarding the next appointment.

BI-RADS CATEGORY  2: Benign.

## 2022-02-07 ENCOUNTER — Other Ambulatory Visit: Payer: Self-pay | Admitting: Gynecology

## 2022-02-07 DIAGNOSIS — R928 Other abnormal and inconclusive findings on diagnostic imaging of breast: Secondary | ICD-10-CM

## 2022-03-20 ENCOUNTER — Ambulatory Visit
Admission: RE | Admit: 2022-03-20 | Discharge: 2022-03-20 | Disposition: A | Discharge status: Home / Self Care | Payer: Commercial Managed Care - HMO | Source: Ambulatory Visit | Attending: Gynecology | Admitting: Gynecology

## 2022-03-20 ENCOUNTER — Ambulatory Visit: Admission: RE | Admit: 2022-03-20 | Payer: BLUE CROSS/BLUE SHIELD | Source: Ambulatory Visit

## 2022-03-20 DIAGNOSIS — R928 Other abnormal and inconclusive findings on diagnostic imaging of breast: Secondary | ICD-10-CM

## 2023-02-19 ENCOUNTER — Other Ambulatory Visit: Payer: Self-pay | Admitting: Gynecology

## 2023-02-19 DIAGNOSIS — Z13820 Encounter for screening for osteoporosis: Secondary | ICD-10-CM

## 2023-05-13 DIAGNOSIS — Z23 Encounter for immunization: Secondary | ICD-10-CM | POA: Diagnosis not present

## 2023-05-13 DIAGNOSIS — Z0000 Encounter for general adult medical examination without abnormal findings: Secondary | ICD-10-CM | POA: Diagnosis not present

## 2023-06-30 DIAGNOSIS — K080 Exfoliation of teeth due to systemic causes: Secondary | ICD-10-CM | POA: Diagnosis not present

## 2023-07-09 DIAGNOSIS — Z23 Encounter for immunization: Secondary | ICD-10-CM | POA: Diagnosis not present

## 2023-07-09 DIAGNOSIS — I10 Essential (primary) hypertension: Secondary | ICD-10-CM | POA: Diagnosis not present

## 2023-07-09 DIAGNOSIS — E119 Type 2 diabetes mellitus without complications: Secondary | ICD-10-CM | POA: Diagnosis not present

## 2023-07-09 DIAGNOSIS — E7800 Pure hypercholesterolemia, unspecified: Secondary | ICD-10-CM | POA: Diagnosis not present

## 2023-07-09 DIAGNOSIS — Z6831 Body mass index (BMI) 31.0-31.9, adult: Secondary | ICD-10-CM | POA: Diagnosis not present

## 2023-09-08 ENCOUNTER — Ambulatory Visit
Admission: RE | Admit: 2023-09-08 | Discharge: 2023-09-08 | Disposition: A | Discharge status: Home / Self Care | Payer: Commercial Managed Care - HMO | Source: Ambulatory Visit | Attending: Gynecology | Admitting: Gynecology

## 2023-09-08 DIAGNOSIS — Z13820 Encounter for screening for osteoporosis: Secondary | ICD-10-CM

## 2023-09-08 DIAGNOSIS — N958 Other specified menopausal and perimenopausal disorders: Secondary | ICD-10-CM | POA: Diagnosis not present

## 2023-09-08 DIAGNOSIS — E2839 Other primary ovarian failure: Secondary | ICD-10-CM | POA: Diagnosis not present

## 2024-01-04 DIAGNOSIS — F325 Major depressive disorder, single episode, in full remission: Secondary | ICD-10-CM | POA: Diagnosis not present

## 2024-01-04 DIAGNOSIS — I10 Essential (primary) hypertension: Secondary | ICD-10-CM | POA: Diagnosis not present

## 2024-01-04 DIAGNOSIS — G479 Sleep disorder, unspecified: Secondary | ICD-10-CM | POA: Diagnosis not present

## 2024-01-04 DIAGNOSIS — E7800 Pure hypercholesterolemia, unspecified: Secondary | ICD-10-CM | POA: Diagnosis not present

## 2024-01-04 DIAGNOSIS — E119 Type 2 diabetes mellitus without complications: Secondary | ICD-10-CM | POA: Diagnosis not present

## 2024-01-05 DIAGNOSIS — K080 Exfoliation of teeth due to systemic causes: Secondary | ICD-10-CM | POA: Diagnosis not present

## 2024-02-24 DIAGNOSIS — N3941 Urge incontinence: Secondary | ICD-10-CM | POA: Diagnosis not present

## 2024-02-24 DIAGNOSIS — Z1231 Encounter for screening mammogram for malignant neoplasm of breast: Secondary | ICD-10-CM | POA: Diagnosis not present

## 2024-02-24 DIAGNOSIS — Z01419 Encounter for gynecological examination (general) (routine) without abnormal findings: Secondary | ICD-10-CM | POA: Diagnosis not present

## 2024-03-02 DIAGNOSIS — L570 Actinic keratosis: Secondary | ICD-10-CM | POA: Diagnosis not present

## 2024-03-02 DIAGNOSIS — W908XXS Exposure to other nonionizing radiation, sequela: Secondary | ICD-10-CM | POA: Diagnosis not present

## 2024-03-02 DIAGNOSIS — L578 Other skin changes due to chronic exposure to nonionizing radiation: Secondary | ICD-10-CM | POA: Diagnosis not present

## 2024-03-02 DIAGNOSIS — D1801 Hemangioma of skin and subcutaneous tissue: Secondary | ICD-10-CM | POA: Diagnosis not present

## 2024-03-02 DIAGNOSIS — L218 Other seborrheic dermatitis: Secondary | ICD-10-CM | POA: Diagnosis not present

## 2024-03-24 DIAGNOSIS — E119 Type 2 diabetes mellitus without complications: Secondary | ICD-10-CM | POA: Diagnosis not present

## 2024-03-24 DIAGNOSIS — H40053 Ocular hypertension, bilateral: Secondary | ICD-10-CM | POA: Diagnosis not present

## 2024-03-24 DIAGNOSIS — H5203 Hypermetropia, bilateral: Secondary | ICD-10-CM | POA: Diagnosis not present

## 2024-03-24 DIAGNOSIS — H40013 Open angle with borderline findings, low risk, bilateral: Secondary | ICD-10-CM | POA: Diagnosis not present

## 2024-03-24 DIAGNOSIS — H53143 Visual discomfort, bilateral: Secondary | ICD-10-CM | POA: Diagnosis not present

## 2024-04-11 DIAGNOSIS — R413 Other amnesia: Secondary | ICD-10-CM | POA: Diagnosis not present

## 2024-04-11 DIAGNOSIS — G479 Sleep disorder, unspecified: Secondary | ICD-10-CM | POA: Diagnosis not present

## 2024-04-11 DIAGNOSIS — E7800 Pure hypercholesterolemia, unspecified: Secondary | ICD-10-CM | POA: Diagnosis not present

## 2024-04-11 DIAGNOSIS — I10 Essential (primary) hypertension: Secondary | ICD-10-CM | POA: Diagnosis not present

## 2024-04-11 DIAGNOSIS — E119 Type 2 diabetes mellitus without complications: Secondary | ICD-10-CM | POA: Diagnosis not present

## 2024-04-11 DIAGNOSIS — Z0000 Encounter for general adult medical examination without abnormal findings: Secondary | ICD-10-CM | POA: Diagnosis not present

## 2024-04-11 DIAGNOSIS — Z23 Encounter for immunization: Secondary | ICD-10-CM | POA: Diagnosis not present
